# Patient Record
Sex: Female | Born: 1955 | Race: White | Hispanic: No | Marital: Single | Smoking: Never smoker
Health system: Southern US, Community
[De-identification: ages and names within clinical notes are randomized; demographics above are authoritative.]

## PROBLEM LIST (undated history)

## (undated) DIAGNOSIS — E119 Type 2 diabetes mellitus without complications: Secondary | ICD-10-CM

---
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
## 2000-02-01 ENCOUNTER — Other Ambulatory Visit: Admission: RE | Admit: 2000-02-01 | Discharge: 2000-02-01 | Payer: Self-pay | Admitting: Obstetrics & Gynecology

## 2000-06-27 ENCOUNTER — Ambulatory Visit (HOSPITAL_COMMUNITY): Admission: RE | Admit: 2000-06-27 | Discharge: 2000-06-27 | Payer: Self-pay | Admitting: Obstetrics & Gynecology

## 2002-04-13 ENCOUNTER — Other Ambulatory Visit: Admission: RE | Admit: 2002-04-13 | Discharge: 2002-04-13 | Payer: Self-pay | Admitting: Obstetrics and Gynecology

## 2003-04-29 ENCOUNTER — Other Ambulatory Visit: Admission: RE | Admit: 2003-04-29 | Discharge: 2003-04-29 | Payer: Self-pay | Admitting: Obstetrics and Gynecology

## 2004-05-19 ENCOUNTER — Other Ambulatory Visit: Admission: RE | Admit: 2004-05-19 | Discharge: 2004-05-19 | Payer: Self-pay | Admitting: Obstetrics and Gynecology

## 2009-02-26 ENCOUNTER — Encounter: Admission: RE | Admit: 2009-02-26 | Discharge: 2009-02-26 | Payer: Self-pay | Admitting: Obstetrics and Gynecology

## 2010-09-11 ENCOUNTER — Ambulatory Visit (HOSPITAL_COMMUNITY)
Admission: RE | Admit: 2010-09-11 | Discharge: 2010-09-11 | Payer: Self-pay | Source: Home / Self Care | Attending: Gastroenterology | Admitting: Gastroenterology

## 2016-05-27 ENCOUNTER — Other Ambulatory Visit: Payer: Self-pay | Admitting: Family Medicine

## 2016-05-27 DIAGNOSIS — Z1231 Encounter for screening mammogram for malignant neoplasm of breast: Secondary | ICD-10-CM

## 2016-06-09 ENCOUNTER — Ambulatory Visit: Payer: Self-pay

## 2016-06-29 ENCOUNTER — Ambulatory Visit
Admission: RE | Admit: 2016-06-29 | Discharge: 2016-06-29 | Disposition: A | Discharge status: Home / Self Care | Payer: BLUE CROSS/BLUE SHIELD | Source: Ambulatory Visit | Attending: Family Medicine | Admitting: Family Medicine

## 2016-06-29 DIAGNOSIS — Z1231 Encounter for screening mammogram for malignant neoplasm of breast: Secondary | ICD-10-CM

## 2017-09-12 ENCOUNTER — Other Ambulatory Visit: Payer: Self-pay | Admitting: Nurse Practitioner

## 2017-09-12 ENCOUNTER — Ambulatory Visit
Admission: RE | Admit: 2017-09-12 | Discharge: 2017-09-12 | Disposition: A | Discharge status: Home / Self Care | Payer: BLUE CROSS/BLUE SHIELD | Source: Ambulatory Visit | Attending: Nurse Practitioner | Admitting: Nurse Practitioner

## 2017-09-12 DIAGNOSIS — Z1231 Encounter for screening mammogram for malignant neoplasm of breast: Secondary | ICD-10-CM

## 2018-08-29 ENCOUNTER — Other Ambulatory Visit: Payer: Self-pay | Admitting: Nurse Practitioner

## 2018-08-29 DIAGNOSIS — Z1231 Encounter for screening mammogram for malignant neoplasm of breast: Secondary | ICD-10-CM

## 2018-09-27 ENCOUNTER — Ambulatory Visit
Admission: RE | Admit: 2018-09-27 | Discharge: 2018-09-27 | Disposition: A | Discharge status: Home / Self Care | Payer: BLUE CROSS/BLUE SHIELD | Source: Ambulatory Visit | Attending: Nurse Practitioner | Admitting: Nurse Practitioner

## 2018-09-27 DIAGNOSIS — Z1231 Encounter for screening mammogram for malignant neoplasm of breast: Secondary | ICD-10-CM

## 2019-02-10 ENCOUNTER — Inpatient Hospital Stay (HOSPITAL_COMMUNITY)
Admission: EM | Admit: 2019-02-10 | Discharge: 2019-02-13 | DRG: 638 | Disposition: A | Discharge status: Home / Self Care | Payer: BC Managed Care – PPO | Attending: Family Medicine | Admitting: Family Medicine

## 2019-02-10 ENCOUNTER — Encounter (HOSPITAL_COMMUNITY): Payer: Self-pay | Admitting: Emergency Medicine

## 2019-02-10 ENCOUNTER — Emergency Department (HOSPITAL_COMMUNITY): Payer: BC Managed Care – PPO

## 2019-02-10 DIAGNOSIS — Z1159 Encounter for screening for other viral diseases: Secondary | ICD-10-CM | POA: Diagnosis not present

## 2019-02-10 DIAGNOSIS — D649 Anemia, unspecified: Secondary | ICD-10-CM | POA: Diagnosis present

## 2019-02-10 DIAGNOSIS — E1310 Other specified diabetes mellitus with ketoacidosis without coma: Secondary | ICD-10-CM | POA: Diagnosis not present

## 2019-02-10 DIAGNOSIS — N179 Acute kidney failure, unspecified: Secondary | ICD-10-CM | POA: Diagnosis not present

## 2019-02-10 DIAGNOSIS — E1110 Type 2 diabetes mellitus with ketoacidosis without coma: Principal | ICD-10-CM

## 2019-02-10 DIAGNOSIS — D72829 Elevated white blood cell count, unspecified: Secondary | ICD-10-CM | POA: Diagnosis present

## 2019-02-10 DIAGNOSIS — Z794 Long term (current) use of insulin: Secondary | ICD-10-CM | POA: Diagnosis not present

## 2019-02-10 DIAGNOSIS — N390 Urinary tract infection, site not specified: Secondary | ICD-10-CM | POA: Diagnosis present

## 2019-02-10 DIAGNOSIS — E860 Dehydration: Secondary | ICD-10-CM | POA: Diagnosis present

## 2019-02-10 DIAGNOSIS — E11649 Type 2 diabetes mellitus with hypoglycemia without coma: Secondary | ICD-10-CM | POA: Diagnosis not present

## 2019-02-10 DIAGNOSIS — R9431 Abnormal electrocardiogram [ECG] [EKG]: Secondary | ICD-10-CM | POA: Diagnosis present

## 2019-02-10 DIAGNOSIS — Z20828 Contact with and (suspected) exposure to other viral communicable diseases: Secondary | ICD-10-CM | POA: Diagnosis present

## 2019-02-10 DIAGNOSIS — R1115 Cyclical vomiting syndrome unrelated to migraine: Secondary | ICD-10-CM | POA: Diagnosis present

## 2019-02-10 HISTORY — DX: Type 2 diabetes mellitus without complications: E11.9

## 2019-02-10 LAB — BLOOD GAS, ARTERIAL
Acid-base deficit: 28.2 mmol/L — ABNORMAL HIGH (ref 0.0–2.0)
Bicarbonate: 4.7 mmol/L — ABNORMAL LOW (ref 20.0–28.0)
FIO2: 21
O2 Saturation: 96 %
Patient temperature: 37
pCO2 arterial: 10 mmHg — CL (ref 32.0–48.0)
pH, Arterial: 6.931 — CL (ref 7.350–7.450)
pO2, Arterial: 144 mmHg — ABNORMAL HIGH (ref 83.0–108.0)

## 2019-02-10 LAB — CBG MONITORING, ED
Glucose-Capillary: >600 mg/dL (ref 70–99)
Glucose-Capillary: >600 mg/dL (ref 70–99)

## 2019-02-10 LAB — COMPREHENSIVE METABOLIC PANEL
ALT: 14 U/L (ref 0–44)
AST: 14 U/L — ABNORMAL LOW (ref 15–41)
Albumin: 4.4 g/dL (ref 3.5–5.0)
Alkaline Phosphatase: 144 U/L — ABNORMAL HIGH (ref 38–126)
Anion gap: 41 — ABNORMAL HIGH (ref 5–15)
BUN: 46 mg/dL — ABNORMAL HIGH (ref 8–23)
CO2: 5 mmol/L — ABNORMAL LOW (ref 22–32)
Calcium: 9 mg/dL (ref 8.9–10.3)
Chloride: 88 mmol/L — ABNORMAL LOW (ref 98–111)
Creatinine, Ser: 2.62 mg/dL — ABNORMAL HIGH (ref 0.44–1.00)
GFR calc Af Amer: 22 mL/min — ABNORMAL LOW (ref >60)
GFR calc non Af Amer: 19 mL/min — ABNORMAL LOW (ref >60)
Glucose, Bld: 1030 mg/dL (ref 70–99)
Potassium: 5.1 mmol/L (ref 3.5–5.1)
Sodium: 134 mmol/L — ABNORMAL LOW (ref 135–145)
Total Bilirubin: 2.4 mg/dL — ABNORMAL HIGH (ref 0.3–1.2)
Total Protein: 7.8 g/dL (ref 6.5–8.1)

## 2019-02-10 LAB — CBC WITH DIFFERENTIAL/PLATELET
Abs Immature Granulocytes: 2.11 10*3/uL — ABNORMAL HIGH (ref 0.00–0.07)
Abs Immature Granulocytes: 1.97 10*3/uL — ABNORMAL HIGH (ref 0.00–0.07)
Basophils Absolute: 0.4 10*3/uL — ABNORMAL HIGH (ref 0.0–0.1)
Basophils Absolute: 0.3 10*3/uL — ABNORMAL HIGH (ref 0.0–0.1)
Basophils Relative: 1 %
Basophils Relative: 1 %
Eosinophils Absolute: 0.1 10*3/uL (ref 0.0–0.5)
Eosinophils Absolute: 0.1 10*3/uL (ref 0.0–0.5)
Eosinophils Relative: 0 %
Eosinophils Relative: 0 %
HCT: 41.1 % (ref 36.0–46.0)
HCT: 36 % (ref 36.0–46.0)
Hemoglobin: 11.9 g/dL — ABNORMAL LOW (ref 12.0–15.0)
Hemoglobin: 10.5 g/dL — ABNORMAL LOW (ref 12.0–15.0)
Immature Granulocytes: 5 %
Immature Granulocytes: 5 %
Lymphocytes Relative: 5 %
Lymphocytes Relative: 6 %
Lymphs Abs: 2.1 10*3/uL (ref 0.7–4.0)
Lymphs Abs: 2.5 10*3/uL (ref 0.7–4.0)
MCH: 30.8 pg (ref 26.0–34.0)
MCH: 30.9 pg (ref 26.0–34.0)
MCHC: 29 g/dL — ABNORMAL LOW (ref 30.0–36.0)
MCHC: 29.2 g/dL — ABNORMAL LOW (ref 30.0–36.0)
MCV: 106.5 fL — ABNORMAL HIGH (ref 80.0–100.0)
MCV: 105.9 fL — ABNORMAL HIGH (ref 80.0–100.0)
Monocytes Absolute: 3.7 10*3/uL — ABNORMAL HIGH (ref 0.1–1.0)
Monocytes Absolute: 1.7 10*3/uL — ABNORMAL HIGH (ref 0.1–1.0)
Monocytes Relative: 9 %
Monocytes Relative: 4 %
Neutro Abs: 32.3 10*3/uL — ABNORMAL HIGH (ref 1.7–7.7)
Neutro Abs: 32.7 10*3/uL — ABNORMAL HIGH (ref 1.7–7.7)
Neutrophils Relative %: 80 %
Neutrophils Relative %: 84 %
Platelets: 580 10*3/uL — ABNORMAL HIGH (ref 150–400)
Platelets: 522 10*3/uL — ABNORMAL HIGH (ref 150–400)
RBC: 3.86 MIL/uL — ABNORMAL LOW (ref 3.87–5.11)
RBC: 3.4 MIL/uL — ABNORMAL LOW (ref 3.87–5.11)
RDW: 13.7 % (ref 11.5–15.5)
RDW: 13.6 % (ref 11.5–15.5)
WBC: 40.7 10*3/uL — ABNORMAL HIGH (ref 4.0–10.5)
WBC: 39.3 10*3/uL — ABNORMAL HIGH (ref 4.0–10.5)
nRBC: 0 % (ref 0.0–0.2)
nRBC: 0 % (ref 0.0–0.2)

## 2019-02-10 LAB — SARS CORONAVIRUS 2 BY RT PCR (HOSPITAL ORDER, PERFORMED IN ~~LOC~~ HOSPITAL LAB): SARS Coronavirus 2: NEGATIVE

## 2019-02-10 LAB — LACTIC ACID, PLASMA: Lactic Acid, Venous: 2.2 mmol/L (ref 0.5–1.9)

## 2019-02-10 MED ORDER — INSULIN REGULAR(HUMAN) IN NACL 100-0.9 UT/100ML-% IV SOLN
INTRAVENOUS | Status: DC
  Administered 2019-02-10: 22:00:00 5.4 [IU]/h via INTRAVENOUS
  Filled 2019-02-10: qty 100

## 2019-02-10 MED ORDER — DEXTROSE-NACL 5-0.45 % IV SOLN: INTRAVENOUS | Status: DC

## 2019-02-10 MED ORDER — SODIUM CHLORIDE 0.9 % IV SOLN
1.0000 g | Freq: Once | INTRAVENOUS | Status: AC
  Administered 2019-02-10: 1 g via INTRAVENOUS
  Filled 2019-02-10: qty 10

## 2019-02-10 MED ORDER — DEXTROSE 50 % IV SOLN: 25.0000 mL | INTRAVENOUS | Status: DC | PRN

## 2019-02-10 MED ORDER — ONDANSETRON HCL 4 MG/2ML IJ SOLN
4.0000 mg | Freq: Once | INTRAMUSCULAR | Status: AC
  Administered 2019-02-10: 4 mg via INTRAVENOUS
  Filled 2019-02-10: qty 2

## 2019-02-10 MED ORDER — SODIUM CHLORIDE 0.9 % IV BOLUS
2000.0000 mL | Freq: Once | INTRAVENOUS | Status: AC
  Administered 2019-02-10: 2000 mL via INTRAVENOUS

## 2019-02-10 MED ORDER — SODIUM CHLORIDE 0.9 % IV SOLN
INTRAVENOUS | Status: DC
  Administered 2019-02-10: via INTRAVENOUS

## 2019-02-10 MED ORDER — INSULIN REGULAR BOLUS VIA INFUSION
0.0000 [IU] | Freq: Three times a day (TID) | INTRAVENOUS | Status: DC
  Filled 2019-02-10: qty 10

## 2019-02-10 MED ORDER — SODIUM CHLORIDE 0.9 % IV BOLUS
1000.0000 mL | Freq: Once | INTRAVENOUS | Status: AC
  Administered 2019-02-10: 22:00:00 1000 mL via INTRAVENOUS

## 2019-02-10 NOTE — ED Provider Notes (Signed)
[facility] EMERGENCY DEPARTMENT Provider Note   CSN: [id number] Arrival date & time: 02/10/19  2016     History   Chief Complaint Chief Complaint  Patient presents with  . Emesis  . Hyperglycemia    HPI Alicia KinsmanRebecca L Andrews is a 63 y.o. female.      Emesis Associated symptoms: diarrhea   Hyperglycemia Associated symptoms: vomiting    LEVEL 5 Caveat Level 5 caveat due to condition of the patient Alicia Andrews is a 63 y.o. female with hx of DM, who presents to the Emergency Department complaining of nausea, vomiting, and diarrhea.  Symptoms began 1 day prior to arrival.  Per EMS patient was slow to respond to questioning at home and blood sugar was 530.  Patient admits that she has not been checking her blood sugars at home and she is unclear if she has been taking her insulin or metformin.  She also reports having some burning with urination for few days.  She denies pain, fever, chills, shortness of breath.      Past Medical History:  Diagnosis Date  . Diabetes mellitus without complication (HCC)    Type 2     There are no active problems to display for this patient.   History reviewed. No pertinent surgical history.   OB History   No obstetric history on file.      Home Medications    Prior to Admission medications   Not on File    Family History History reviewed. No pertinent family history.  Social History Social History   Tobacco Use  . Smoking status: Never Smoker  . Smokeless tobacco: Never Used  Substance Use Topics  . Alcohol use: Not on file  . Drug use: Not on file     Allergies   Patient has no allergy information on record.   Review of Systems Review of Systems  Unable to perform ROS: Acuity of condition  Gastrointestinal: Positive for diarrhea and vomiting.     Physical Exam Updated Vital Signs BP (!) 98/56   Pulse (!) 115   Temp 97.8 F (36.6 C) (Oral)   Resp 18   Ht 5' (1.524 m)   Wt 57.6 kg   SpO2 100%    BMI 24.80 kg/m   Physical Exam Vitals signs and nursing note reviewed.  Constitutional:      Appearance: She is ill-appearing.  HENT:     Head: Atraumatic.     Mouth/Throat:     Mouth: Mucous membranes are dry.  Eyes:     Pupils: Pupils are equal, round, and reactive to light.  Cardiovascular:     Rate and Rhythm: Tachycardia present.     Pulses: Normal pulses.  Pulmonary:     Effort: Pulmonary effort is normal. Tachypnea present.     Breath sounds: No wheezing or rales.  Abdominal:     General: There is no distension.     Palpations: Abdomen is soft. There is no mass.     Tenderness: There is no abdominal tenderness. There is no guarding.  Musculoskeletal: Normal range of motion.  Skin:    General: Skin is warm.     Capillary Refill: Capillary refill takes less than 2 seconds.     Findings: No rash.  Neurological:     Mental Status: She is lethargic.     GCS: GCS eye subscore is 3. GCS verbal subscore is 4. GCS motor subscore is 6.     Sensory: Sensation is intact.  Motor: Motor function is intact.      ED Treatments / Results  Labs (all labs ordered are listed, but only abnormal results are displayed) Labs Reviewed  CBC WITH DIFFERENTIAL/PLATELET - Abnormal; Notable for the following components:      Result Value   WBC 40.7 (*)    RBC 3.86 (*)    Hemoglobin 11.9 (*)    MCV 106.5 (*)    MCHC 29.0 (*)    Platelets 580 (*)    Neutro Abs 32.3 (*)    Monocytes Absolute 3.7 (*)    Basophils Absolute 0.4 (*)    Abs Immature Granulocytes 2.11 (*)    All other components within normal limits  COMPREHENSIVE METABOLIC PANEL - Abnormal; Notable for the following components:   Sodium 134 (*)    Chloride 88 (*)    CO2 5 (*)    Glucose, Bld 1,030 (*)    BUN 46 (*)    Creatinine, Ser 2.62 (*)    AST 14 (*)    Alkaline Phosphatase 144 (*)    Total Bilirubin 2.4 (*)    GFR calc non Af Amer 19 (*)    GFR calc Af Amer 22 (*)    Anion gap 41 (*)    All other  components within normal limits  BLOOD GAS, ARTERIAL - Abnormal; Notable for the following components:   pH, Arterial 6.931 (*)    pCO2 arterial 10.0 (*)    pO2, Arterial 144 (*)    Bicarbonate 4.7 (*)    Acid-base deficit 28.2 (*)    Allens test (pass/fail) NOT INDICATED (*)    All other components within normal limits  CBG MONITORING, ED - Abnormal; Notable for the following components:   Glucose-Capillary >600 (*)    All other components within normal limits  SARS CORONAVIRUS 2 (HOSPITAL ORDER, PERFORMED IN [facility])  CULTURE, BLOOD (ROUTINE X 2)  CULTURE, BLOOD (ROUTINE X 2)  LACTIC ACID, PLASMA  LACTIC ACID, PLASMA  CBC WITH DIFFERENTIAL/PLATELET  URINALYSIS, ROUTINE W REFLEX MICROSCOPIC  I-STAT CHEM 8, ED    EKG   EKG reviewed by Dr. Estell HarpinZammit   Radiology Dg Chest Portable 1 View  Result Date: 02/10/2019 CLINICAL DATA:  Nausea, vomiting, diarrhea beginning yesterday at 1700 hours, hyperglycemia, semi responsive, pain, history type II diabetes mellitus EXAM: PORTABLE CHEST 1 VIEW COMPARISON:  Portable exam 2111 hours without priors for comparison FINDINGS: Normal heart size, mediastinal contours, and pulmonary vascularity. Lungs clear. No pleural effusion or pneumothorax. Bones unremarkable. IMPRESSION: No acute abnormalities. Electronically Signed   By: Ulyses SouthwardMark  Boles M.D.   On: 02/10/2019 21:40    Procedures Procedures (including critical care time)  Medications Ordered in ED Medications  dextrose 5 %-0.45 % sodium chloride infusion (has no administration in time range)  insulin regular bolus via infusion 0-10 Units (has no administration in time range)  insulin regular, human (MYXREDLIN) 100 units/ 100 mL infusion (5.4 Units/hr Intravenous New Bag/Given 02/10/19 2135)  dextrose 50 % solution 25 mL (has no administration in time range)  0.9 %  sodium chloride infusion (has no administration in time range)  cefTRIAXone (ROCEPHIN) 1 g in sodium chloride 0.9 %  100 mL IVPB (has no administration in time range)  sodium chloride 0.9 % bolus 2,000 mL (0 mLs Intravenous Stopped 02/10/19 2208)  ondansetron (ZOFRAN) injection 4 mg (4 mg Intravenous Given 02/10/19 2136)  sodium chloride 0.9 % bolus 1,000 mL (1,000 mLs Intravenous New Bag/Given 02/10/19 2208)  Initial Impression / Assessment and Plan / ED Course  I have reviewed the triage vital signs and the nursing notes.  Pertinent labs & imaging results that were available during my care of the patient were reviewed by me and considered in my medical decision making (see chart for details).        Patient tachycardic, tachypneic with nausea vomiting diarrhea reported for 24 hours.  CBG here greater than 600.  Symptoms likely DKA.  Code sepsis initiated.  Patient also seen by Dr. Roderic Palau and care plan discussed.  Will initiate antibiotics, IV fluids, and glucose stabilizer.  CRITICAL CARE Performed by: Antoneo Ghrist Total critical care time: 30 minutes Critical care time was exclusive of separately billable procedures and treating other patients. Critical care was necessary to treat or prevent imminent or life-threatening deterioration. Critical care was time spent personally by me on the following activities: development of treatment plan with patient and/or surrogate as well as nursing, discussions with consultants, evaluation of patient's response to treatment, examination of patient, obtaining history from patient or surrogate, ordering and performing treatments and interventions, ordering and review of laboratory studies, ordering and review of radiographic studies, pulse oximetry and re-evaluation of patient's condition.   On recheck, patient appears to be improving after IV fluids and insulin.  Mentation improving and she appears more alert.    [place] Dr. Darrick Meigs and discussed findings, he agrees to admit.    Final Clinical Impressions(s) / ED Diagnoses   Final diagnoses:  Diabetic  ketoacidosis without coma associated with other specified diabetes mellitus [facility])    ED Discharge Orders    None       Kem Parkinson, PA-C 02/10/19 2241    Milton Ferguson, MD 02/13/19 1308

## 2019-02-10 NOTE — ED Notes (Signed)
Critical Blood gas Result given To Dr Roderic Palau by RT. Nurse notified. Results in computer.

## 2019-02-10 NOTE — H&P (Addendum)
TRH H&P    Patient Demographics:    Alicia ChafeRebecca Kovacich, is a 63 y.o. female  MRN: [id number]  DOB - May 15, 1956  Admit Date - 02/10/2019  Referring MD/NP/PA: Dr. Estell HarpinZammit  Outpatient Primary MD for the patient is [name], Teresa, FNP  Patient coming from: Home  Chief complaint-vomiting   HPI:    Alicia Andrews  is a 63 y.o. female, with history of diabetes mellitus type 2, came to hospital with complaints of nausea vomiting and diarrhea which began on Wednesday evening.  Patient says that she usually takes insulin 70/30 twice a day, her last dose was on Wednesday.  Patient says that she was vomiting, and also developed diarrhea.  She complains of dysuria.  Denies abdominal pain. Denies chest pain, shortness of breath, fever or chills. SARS-CoV-2 is negative in the ED. Lab work showed blood glucose of 1031.  Patient was found to be in DKA with anion gap of 41. Creatinine 2.62.  Unknown baseline. ABG showed pH 6.931, PCO2 10.0. Patient started on DKA protocol. Also started on ceftriaxone for possible UTI    Review of systems:    In addition to the HPI above,   All other systems reviewed and are negative.    Past History of the following :    Past Medical History:  Diagnosis Date  . Diabetes mellitus without complication (HCC)    Type 2       History reviewed. No pertinent surgical history.    Social History:      Social History   Tobacco Use  . Smoking status: Never Smoker  . Smokeless tobacco: Never Used  Substance Use Topics  . Alcohol use: Not on file       Family History :   Unremarkable   Home Medications:   Prior to Admission medications   Medication Sig Start Date End Date Taking? Authorizing Provider  atorvastatin (LIPITOR) 10 MG tablet Take 10 mg by mouth daily. 11/05/18  Yes [provider]  insulin NPH-regular Human (NOVOLIN 70/30 RELION) (70-30) 100  UNIT/ML injection Inject 25-50 Units as directed See admin instructions. INJECT 30 MINUTES BEFORE BREAKFAST AND SUPPER BASED ON TITRATION GUIDELINE, 25 TO 50 UNITS EACH MEAL 09/20/18  Yes [provider]  metFORMIN (GLUCOPHAGE-XR) 500 MG 24 hr tablet Take 2 tablets by mouth 2 (two) times a day. 09/20/18  Yes [provider]     Allergies:    Not on File   Physical Exam:   Vitals  Blood pressure (!) 92/57, pulse (!) 109, temperature 97.8 F (36.6 C), temperature source Oral, resp. rate (!) 23, height 5' (1.524 m), weight 57.6 kg, SpO2 100 %.  1.  General: Appears lethargic  2. Psychiatric: Lethargic but arousable, answering all questions appropriately.  Intact insight and judgment  3. Neurologic: Cranial nerves II through grossly intact, moving all extremities, no focal neurological deficit.  4. HEENMT:  Atraumatic normocephalic, extraocular muscles are intact  5. Respiratory : Clear to auscultation bilaterally  6. Cardiovascular : S1-S2, regular, no murmur auscultated  7. Gastrointestinal:  Abdomen is soft, nontender, no organomegaly      Data Review:    CBC Recent Labs  Lab 02/10/19 2107 02/10/19 2209  WBC 40.7* 39.3*  HGB 11.9* 10.5*  HCT 41.1 36.0  PLT 580* 522*  MCV 106.5* 105.9*  MCH 30.8 30.9  MCHC 29.0* 29.2*  RDW 13.7 13.6  LYMPHSABS 2.1 2.5  MONOABS 3.7* 1.7*  EOSABS 0.1 0.1  BASOSABS 0.4* 0.3*   ------------------------------------------------------------------------------------------------------------------  Results for orders placed or performed during the hospital encounter of 02/10/19 (from the past 48 hour(s))  CBG monitoring, ED     Status: Abnormal   Collection Time: 02/10/19  8:37 PM  Result Value Ref Range   Glucose-Capillary >600 (HH) 70 - 99 mg/dL  CBC with Differential/Platelet     Status: Abnormal   Collection Time: 02/10/19  9:07 PM  Result Value Ref Range   WBC 40.7 (H) 4.0 - 10.5 K/uL   RBC 3.86 (L) 3.87 -  5.11 MIL/uL   Hemoglobin 11.9 (L) 12.0 - 15.0 g/dL   HCT 16.141.1 09.636.0 - 04.546.0 %   MCV 106.5 (H) 80.0 - 100.0 fL   MCH 30.8 26.0 - 34.0 pg   MCHC 29.0 (L) 30.0 - 36.0 g/dL   RDW 40.913.7 81.111.5 - 91.415.5 %   Platelets 580 (H) 150 - 400 K/uL   nRBC 0.0 0.0 - 0.2 %   Neutrophils Relative % 80 %   Neutro Abs 32.3 (H) 1.7 - 7.7 K/uL   Lymphocytes Relative 5 %   Lymphs Abs 2.1 0.7 - 4.0 K/uL   Monocytes Relative 9 %   Monocytes Absolute 3.7 (H) 0.1 - 1.0 K/uL   Eosinophils Relative 0 %   Eosinophils Absolute 0.1 0.0 - 0.5 K/uL   Basophils Relative 1 %   Basophils Absolute 0.4 (H) 0.0 - 0.1 K/uL   Immature Granulocytes 5 %   Abs Immature Granulocytes 2.11 (H) 0.00 - 0.07 K/uL    Comment: Performed at [facility], [address] [place], [place] [address]  Comprehensive metabolic panel     Status: Abnormal   Collection Time: 02/10/19  9:07 PM  Result Value Ref Range   Sodium 134 (L) 135 - 145 mmol/L   Potassium 5.1 3.5 - 5.1 mmol/L   Chloride 88 (L) 98 - 111 mmol/L   CO2 5 (L) 22 - 32 mmol/L   Glucose, Bld 1,030 (HH) 70 - 99 mg/dL    Comment: CRITICAL RESULT CALLED TO, READ BACK BY AND VERIFIED WITH: WALKER,T @ 2158 ON 02/10/19 BY JUW    BUN 46 (H) 8 - 23 mg/dL   Creatinine, Ser 6.212.62 (H) 0.44 - 1.00 mg/dL   Calcium 9.0 8.9 - 30.810.3 mg/dL   Total Protein 7.8 6.5 - 8.1 g/dL   Albumin 4.4 3.5 - 5.0 g/dL   AST 14 (L) 15 - 41 U/L   ALT 14 0 - 44 U/L   Alkaline Phosphatase 144 (H) 38 - 126 U/L   Total Bilirubin 2.4 (H) 0.3 - 1.2 mg/dL   GFR calc non Af Amer 19 (L) >60 mL/min   GFR calc Af Amer 22 (L) >60 mL/min   Anion gap 41 (H) 5 - 15    Comment: Performed at [facility], [address] [place], [place] [address]  SARS Coronavirus 2 (CEPHEID- Performed in [facility] hospital lab), Hosp Order     Status: None   Collection Time: 02/10/19  9:10 PM   Specimen: Nasopharyngeal Swab  Result Value Ref Range  SARS Coronavirus 2 NEGATIVE NEGATIVE    Comment: (NOTE) If result is NEGATIVE SARS-CoV-2  target nucleic acids are NOT DETECTED. The SARS-CoV-2 RNA is generally detectable in upper and lower  respiratory specimens during the acute phase of infection. The lowest  concentration of SARS-CoV-2 viral copies this assay can detect is 250  copies / mL. A negative result does not preclude SARS-CoV-2 infection  and should not be used as the sole basis for treatment or other  patient management decisions.  A negative result may occur with  improper specimen collection / handling, submission of specimen other  than nasopharyngeal swab, presence of viral mutation(s) within the  areas targeted by this assay, and inadequate number of viral copies  (<250 copies / mL). A negative result must be combined with clinical  observations, patient history, and epidemiological information. If result is POSITIVE SARS-CoV-2 target nucleic acids are DETECTED. The SARS-CoV-2 RNA is generally detectable in upper and lower  respiratory specimens dur ing the acute phase of infection.  Positive  results are indicative of active infection with SARS-CoV-2.  Clinical  correlation with patient history and other diagnostic information is  necessary to determine patient infection status.  Positive results do  not rule out bacterial infection or co-infection with other viruses. If result is PRESUMPTIVE POSTIVE SARS-CoV-2 nucleic acids MAY BE PRESENT.   A presumptive positive result was obtained on the submitted specimen  and confirmed on repeat testing.  While 2019 novel coronavirus  (SARS-CoV-2) nucleic acids may be present in the submitted sample  additional confirmatory testing may be necessary for epidemiological  and / or clinical management purposes  to differentiate between  SARS-CoV-2 and other Sarbecovirus currently known to infect humans.  If clinically indicated additional testing with an alternate test  methodology [phone]) is advised. The SARS-CoV-2 RNA is generally  detectable in upper and lower  respiratory sp ecimens during the acute  phase of infection. The expected result is Negative. Fact Sheet for Patients:  StrictlyIdeas.no Fact Sheet for Healthcare Providers: BankingDealers.co.za This test is not yet approved or cleared by the [place] FDA and has been authorized for detection and/or diagnosis of SARS-CoV-2 by FDA under an Emergency Use Authorization (EUA).  This EUA will remain in effect (meaning this test can be used) for the duration of the COVID-19 declaration under Section 564(b)(1) of the Act, 21 U.S.C. section 360bbb-3(b)(1), unless the authorization is terminated or revoked sooner. Performed at [facility], [address], [place], [place] [address]   Blood gas, arterial     Status: Abnormal   Collection Time: 02/10/19 10:04 PM  Result Value Ref Range   FIO2 21.00    pH, Arterial 6.931 (LL) 7.350 - 7.450    Comment: CRITICAL RESULT CALLED TO, READ BACK BY AND VERIFIED WITH: DR.ZAMMIT @ 2213 ON 02/10/19 BY JUW    pCO2 arterial 10.0 (LL) 32.0 - 48.0 mmHg    Comment: CRITICAL RESULT CALLED TO, READ BACK BY AND VERIFIED WITH: DR.ZAMMIT @ 2213 ON 02/10/19 BY JUW    pO2, Arterial 144 (H) 83.0 - 108.0 mmHg   Bicarbonate 4.7 (L) 20.0 - 28.0 mmol/L   Acid-base deficit 28.2 (H) 0.0 - 2.0 mmol/L   O2 Saturation 96.0 %   Patient temperature 37.0    Allens test (pass/fail) NOT INDICATED (A) PASS    Comment: Performed at [facility], [address] [place], [place] [address]  Lactic acid, plasma     Status: Abnormal   Collection Time: 02/10/19 10:09  PM  Result Value Ref Range   Lactic Acid, Venous 2.2 (HH) 0.5 - 1.9 mmol/L    Comment: CRITICAL RESULT CALLED TO, READ BACK BY AND VERIFIED WITH: POINDEXTER,M @ 2317 ON 02/10/19 BY JUW Performed at [facility], [address], [place], [place] [address]   CBC WITH DIFFERENTIAL     Status: Abnormal   Collection Time: 02/10/19 10:09 PM  Result Value Ref Range   WBC 39.3  (H) 4.0 - 10.5 K/uL   RBC 3.40 (L) 3.87 - 5.11 MIL/uL   Hemoglobin 10.5 (L) 12.0 - 15.0 g/dL   HCT 13.0 86.5 - 78.4 %   MCV 105.9 (H) 80.0 - 100.0 fL   MCH 30.9 26.0 - 34.0 pg   MCHC 29.2 (L) 30.0 - 36.0 g/dL   RDW 69.6 29.5 - 28.4 %   Platelets 522 (H) 150 - 400 K/uL   nRBC 0.0 0.0 - 0.2 %   Neutrophils Relative % 84 %   Neutro Abs 32.7 (H) 1.7 - 7.7 K/uL   Lymphocytes Relative 6 %   Lymphs Abs 2.5 0.7 - 4.0 K/uL   Monocytes Relative 4 %   Monocytes Absolute 1.7 (H) 0.1 - 1.0 K/uL   Eosinophils Relative 0 %   Eosinophils Absolute 0.1 0.0 - 0.5 K/uL   Basophils Relative 1 %   Basophils Absolute 0.3 (H) 0.0 - 0.1 K/uL   Immature Granulocytes 5 %   Abs Immature Granulocytes 1.97 (H) 0.00 - 0.07 K/uL    Comment: Performed at [facility], [address] [place], [place] [address]  Blood Culture (routine x 2)     Status: None (Preliminary result)   Collection Time: 02/10/19 10:10 PM   Specimen: BLOOD LEFT FOREARM  Result Value Ref Range   Specimen Description BLOOD LEFT FOREARM    Special Requests      BOTTLES DRAWN AEROBIC AND ANAEROBIC Blood Culture adequate volume Performed at [facility], [address], [place], [place] [address]    Culture PENDING    Report Status PENDING   Blood Culture (routine x 2)     Status: None (Preliminary result)   Collection Time: 02/10/19 10:20 PM   Specimen: Left Antecubital; Blood  Result Value Ref Range   Specimen Description LEFT ANTECUBITAL    Special Requests      BOTTLES DRAWN AEROBIC AND ANAEROBIC Blood Culture adequate volume Performed at [facility], [address] [place], [place] [address]    Culture PENDING    Report Status PENDING   CBG monitoring, ED     Status: Abnormal   Collection Time: 02/10/19 10:45 PM  Result Value Ref Range   Glucose-Capillary >600 (HH) 70 - 99 mg/dL    Chemistries  Recent Labs  Lab 02/10/19 2107  NA 134*  K 5.1  CL 88*  CO2 5*  GLUCOSE 1,030*  BUN 46*  CREATININE 2.62*  CALCIUM 9.0   AST 14*  ALT 14  ALKPHOS 144*  BILITOT 2.4*   ------------------------------------------------------------------------------------------------------------------  ------------------------------------------------------------------------------------------------------------------ GFR: Estimated Creatinine Clearance: 17.9 mL/min (A) (by C-G formula based on SCr of 2.62 mg/dL (H)). Liver Function Tests: Recent Labs  Lab 02/10/19 2107  AST 14*  ALT 14  ALKPHOS 144*  BILITOT 2.4*  PROT 7.8  ALBUMIN 4.4   CBG: Recent Labs  Lab 02/10/19 2037 02/10/19 2245  GLUCAP >600* >600*      Imaging Results:    Dg Chest Portable 1 View  Result Date: 02/10/2019 CLINICAL DATA:  Nausea, vomiting, diarrhea beginning  yesterday at 1700 hours, hyperglycemia, semi responsive, pain, history type II diabetes mellitus EXAM: PORTABLE CHEST 1 VIEW COMPARISON:  Portable exam 2111 hours without priors for comparison FINDINGS: Normal heart size, mediastinal contours, and pulmonary vascularity. Lungs clear. No pleural effusion or pneumothorax. Bones unremarkable. IMPRESSION: No acute abnormalities. Electronically Signed   By: Ulyses SouthwardMark  Boles M.D.   On: 02/10/2019 21:40    My personal review of EKG: Rhythm NSR, QTC prolonged at 502   Assessment & Plan:    Active Problems:   DKA (diabetic ketoacidoses) (HCC)   1. Diabetic ketoacidosis-patient has severe DKA, anion gap of 41.  Will admit patient to stepdown unit.  Started on DKA protocol, ABG showed pH of 6.931.  Will not give bicarb at this time, as usually indicated for pH less than 6.9.  I am hoping that patient's pH is going to improve with aggressive IV fluid hydration as well as IV insulin.  Will repeat ABG.  Would consider bicarb if pH persist less than 7.0.  Check BMP every 4 hours.  2. QTC prolongation-QTC is 502, will check serum magnesium.  3. UTI-patient complained of dysuria.  Started on ceftriaxone.Urine is clear.  Follow urine culture    DVT  Prophylaxis-   Lovenox   AM Labs Ordered, also please review Full Orders  Family Communication: Admission, patients condition and plan of care including tests being ordered have been discussed with the patient  who indicate understanding and agree with the plan and Code Status.  Code Status: Full code  Admission status: Inpatient: Based on patients clinical presentation and evaluation of above clinical data, I have made determination that patient meets Inpatient criteria at this time.  Time spent in minutes : 60 minutes   Meredeth IdeGagan S Tyniah Kastens M.D on 02/10/2019 at 11:45 PM

## 2019-02-10 NOTE — ED Notes (Addendum)
CRITICAL VALUE ALERT  Critical Value:  Blood Glucose 1,030  Date & Time Notied:  02/10/19 2200  Provider Notified: EDP Zammit   Orders Received/Actions taken: No orders at this time

## 2019-02-10 NOTE — ED Triage Notes (Signed)
Pt came via EMS for nausea, vomiting, and diarrhea that started yesterday afternoon at 5pm. Pt blood sugar 530 at EMS arrival.

## 2019-02-10 NOTE — ED Notes (Signed)
Date and time results received: 02/10/19 23:20 (use smartphrase ".now" to insert current time)  Test: lactic acid  Critical Value: 2.2  Name of Provider Notified: Tammy PA  Orders Received? Or Actions Taken?: none

## 2019-02-11 ENCOUNTER — Other Ambulatory Visit: Payer: Self-pay

## 2019-02-11 ENCOUNTER — Encounter (HOSPITAL_COMMUNITY): Payer: Self-pay

## 2019-02-11 DIAGNOSIS — E1110 Type 2 diabetes mellitus with ketoacidosis without coma: Principal | ICD-10-CM

## 2019-02-11 LAB — BASIC METABOLIC PANEL
Anion gap: 33 — ABNORMAL HIGH (ref 5–15)
Anion gap: 25 — ABNORMAL HIGH (ref 5–15)
Anion gap: 20 — ABNORMAL HIGH (ref 5–15)
Anion gap: 12 (ref 5–15)
Anion gap: 10 (ref 5–15)
Anion gap: 8 (ref 5–15)
BUN: 40 mg/dL — ABNORMAL HIGH (ref 8–23)
BUN: 38 mg/dL — ABNORMAL HIGH (ref 8–23)
BUN: 39 mg/dL — ABNORMAL HIGH (ref 8–23)
BUN: 35 mg/dL — ABNORMAL HIGH (ref 8–23)
BUN: 31 mg/dL — ABNORMAL HIGH (ref 8–23)
BUN: 25 mg/dL — ABNORMAL HIGH (ref 8–23)
CO2: 4 mmol/L — ABNORMAL LOW (ref 22–32)
CO2: 9 mmol/L — ABNORMAL LOW (ref 22–32)
CO2: 15 mmol/L — ABNORMAL LOW (ref 22–32)
CO2: 20 mmol/L — ABNORMAL LOW (ref 22–32)
CO2: 21 mmol/L — ABNORMAL LOW (ref 22–32)
CO2: 19 mmol/L — ABNORMAL LOW (ref 22–32)
Calcium: 7.6 mg/dL — ABNORMAL LOW (ref 8.9–10.3)
Calcium: 7.7 mg/dL — ABNORMAL LOW (ref 8.9–10.3)
Calcium: 7.9 mg/dL — ABNORMAL LOW (ref 8.9–10.3)
Calcium: 7.6 mg/dL — ABNORMAL LOW (ref 8.9–10.3)
Calcium: 7.3 mg/dL — ABNORMAL LOW (ref 8.9–10.3)
Calcium: 7.2 mg/dL — ABNORMAL LOW (ref 8.9–10.3)
Chloride: 102 mmol/L (ref 98–111)
Chloride: 109 mmol/L (ref 98–111)
Chloride: 108 mmol/L (ref 98–111)
Chloride: 110 mmol/L (ref 98–111)
Chloride: 109 mmol/L (ref 98–111)
Chloride: 112 mmol/L — ABNORMAL HIGH (ref 98–111)
Creatinine, Ser: 2.1 mg/dL — ABNORMAL HIGH (ref 0.44–1.00)
Creatinine, Ser: 1.91 mg/dL — ABNORMAL HIGH (ref 0.44–1.00)
Creatinine, Ser: 1.64 mg/dL — ABNORMAL HIGH (ref 0.44–1.00)
Creatinine, Ser: 1.29 mg/dL — ABNORMAL HIGH (ref 0.44–1.00)
Creatinine, Ser: 1.11 mg/dL — ABNORMAL HIGH (ref 0.44–1.00)
Creatinine, Ser: 0.81 mg/dL (ref 0.44–1.00)
GFR calc Af Amer: 29 mL/min — ABNORMAL LOW (ref >60)
GFR calc Af Amer: 32 mL/min — ABNORMAL LOW (ref >60)
GFR calc Af Amer: 39 mL/min — ABNORMAL LOW (ref >60)
GFR calc Af Amer: 52 mL/min — ABNORMAL LOW (ref >60)
GFR calc Af Amer: >60 mL/min (ref >60)
GFR calc Af Amer: >60 mL/min (ref >60)
GFR calc non Af Amer: 25 mL/min — ABNORMAL LOW (ref >60)
GFR calc non Af Amer: 28 mL/min — ABNORMAL LOW (ref >60)
GFR calc non Af Amer: 33 mL/min — ABNORMAL LOW (ref >60)
GFR calc non Af Amer: 45 mL/min — ABNORMAL LOW (ref >60)
GFR calc non Af Amer: 54 mL/min — ABNORMAL LOW (ref >60)
GFR calc non Af Amer: >60 mL/min (ref >60)
Glucose, Bld: 712 mg/dL (ref 70–99)
Glucose, Bld: 454 mg/dL — ABNORMAL HIGH (ref 70–99)
Glucose, Bld: 201 mg/dL — ABNORMAL HIGH (ref 70–99)
Glucose, Bld: 141 mg/dL — ABNORMAL HIGH (ref 70–99)
Glucose, Bld: 246 mg/dL — ABNORMAL HIGH (ref 70–99)
Glucose, Bld: 96 mg/dL (ref 70–99)
Potassium: 3.9 mmol/L (ref 3.5–5.1)
Potassium: 3.6 mmol/L (ref 3.5–5.1)
Potassium: 3.4 mmol/L — ABNORMAL LOW (ref 3.5–5.1)
Potassium: 3.3 mmol/L — ABNORMAL LOW (ref 3.5–5.1)
Potassium: 3.3 mmol/L — ABNORMAL LOW (ref 3.5–5.1)
Potassium: 3 mmol/L — ABNORMAL LOW (ref 3.5–5.1)
Sodium: 139 mmol/L (ref 135–145)
Sodium: 143 mmol/L (ref 135–145)
Sodium: 143 mmol/L (ref 135–145)
Sodium: 142 mmol/L (ref 135–145)
Sodium: 140 mmol/L (ref 135–145)
Sodium: 139 mmol/L (ref 135–145)

## 2019-02-11 LAB — GLUCOSE, CAPILLARY
Glucose-Capillary: 119 mg/dL — ABNORMAL HIGH (ref 70–99)
Glucose-Capillary: 120 mg/dL — ABNORMAL HIGH (ref 70–99)
Glucose-Capillary: 124 mg/dL — ABNORMAL HIGH (ref 70–99)
Glucose-Capillary: 130 mg/dL — ABNORMAL HIGH (ref 70–99)
Glucose-Capillary: 141 mg/dL — ABNORMAL HIGH (ref 70–99)
Glucose-Capillary: 167 mg/dL — ABNORMAL HIGH (ref 70–99)
Glucose-Capillary: 180 mg/dL — ABNORMAL HIGH (ref 70–99)
Glucose-Capillary: 185 mg/dL — ABNORMAL HIGH (ref 70–99)
Glucose-Capillary: 193 mg/dL — ABNORMAL HIGH (ref 70–99)
Glucose-Capillary: 199 mg/dL — ABNORMAL HIGH (ref 70–99)
Glucose-Capillary: 82 mg/dL (ref 70–99)
Glucose-Capillary: 86 mg/dL (ref 70–99)
Glucose-Capillary: 93 mg/dL (ref 70–99)

## 2019-02-11 LAB — URINALYSIS, ROUTINE W REFLEX MICROSCOPIC
Bilirubin Urine: NEGATIVE
Glucose, UA: >=500 mg/dL — AB
Ketones, ur: 80 mg/dL — AB
Leukocytes,Ua: NEGATIVE
Nitrite: NEGATIVE
Protein, ur: NEGATIVE mg/dL
Specific Gravity, Urine: 1.015 (ref 1.005–1.030)
pH: 5 (ref 5.0–8.0)

## 2019-02-11 LAB — CBG MONITORING, ED
Glucose-Capillary: >600 mg/dL (ref 70–99)
Glucose-Capillary: 599 mg/dL (ref 70–99)
Glucose-Capillary: 520 mg/dL (ref 70–99)
Glucose-Capillary: 443 mg/dL — ABNORMAL HIGH (ref 70–99)
Glucose-Capillary: 334 mg/dL — ABNORMAL HIGH (ref 70–99)
Glucose-Capillary: 291 mg/dL — ABNORMAL HIGH (ref 70–99)
Glucose-Capillary: 169 mg/dL — ABNORMAL HIGH (ref 70–99)
Glucose-Capillary: 135 mg/dL — ABNORMAL HIGH (ref 70–99)

## 2019-02-11 LAB — CBC
HCT: 33 % — ABNORMAL LOW (ref 36.0–46.0)
Hemoglobin: 10.5 g/dL — ABNORMAL LOW (ref 12.0–15.0)
MCH: 31.2 pg (ref 26.0–34.0)
MCHC: 31.8 g/dL (ref 30.0–36.0)
MCV: 97.9 fL (ref 80.0–100.0)
Platelets: 366 10*3/uL (ref 150–400)
RBC: 3.37 MIL/uL — ABNORMAL LOW (ref 3.87–5.11)
RDW: 13.3 % (ref 11.5–15.5)
WBC: 34.8 10*3/uL — ABNORMAL HIGH (ref 4.0–10.5)
nRBC: 0 % (ref 0.0–0.2)

## 2019-02-11 LAB — MRSA PCR SCREENING: MRSA by PCR: NEGATIVE

## 2019-02-11 LAB — MAGNESIUM: Magnesium: 1.8 mg/dL (ref 1.7–2.4)

## 2019-02-11 LAB — BLOOD GAS, ARTERIAL
Acid-base deficit: 25.5 mmol/L — ABNORMAL HIGH (ref 0.0–2.0)
Bicarbonate: 6.5 mmol/L — ABNORMAL LOW (ref 20.0–28.0)
FIO2: 21
O2 Saturation: 97.2 %
Patient temperature: 37
pCO2 arterial: 11.6 mmHg — CL (ref 32.0–48.0)
pH, Arterial: 7.071 — CL (ref 7.350–7.450)
pO2, Arterial: 132 mmHg — ABNORMAL HIGH (ref 83.0–108.0)

## 2019-02-11 LAB — LACTIC ACID, PLASMA: Lactic Acid, Venous: 2.1 mmol/L (ref 0.5–1.9)

## 2019-02-11 MED ORDER — PNEUMOCOCCAL VAC POLYVALENT 25 MCG/0.5ML IJ INJ: 0.5000 mL | INJECTION | INTRAMUSCULAR | Status: DC

## 2019-02-11 MED ORDER — ENOXAPARIN SODIUM 30 MG/0.3ML ~~LOC~~ SOLN
30.0000 mg | SUBCUTANEOUS | Status: DC
  Administered 2019-02-12: 30 mg via SUBCUTANEOUS
  Filled 2019-02-11: qty 0.3

## 2019-02-11 MED ORDER — SODIUM CHLORIDE 0.9 % IV SOLN
1.0000 g | INTRAVENOUS | Status: DC
  Administered 2019-02-11 – 2019-02-12 (×2): 1 g via INTRAVENOUS
  Filled 2019-02-11 (×2): qty 10

## 2019-02-11 MED ORDER — SODIUM CHLORIDE 0.9 % IV SOLN
INTRAVENOUS | Status: DC
  Administered 2019-02-11: 03:00:00 via INTRAVENOUS

## 2019-02-11 MED ORDER — ONDANSETRON HCL 4 MG/2ML IJ SOLN
4.0000 mg | Freq: Four times a day (QID) | INTRAMUSCULAR | Status: DC | PRN
  Administered 2019-02-11 – 2019-02-12 (×3): 4 mg via INTRAVENOUS
  Filled 2019-02-11 (×3): qty 2

## 2019-02-11 MED ORDER — INSULIN REGULAR(HUMAN) IN NACL 100-0.9 UT/100ML-% IV SOLN
INTRAVENOUS | Status: DC
  Administered 2019-02-11: 16:00:00 2.5 [IU]/h via INTRAVENOUS
  Administered 2019-02-11: 03:00:00 18.4 [IU]/h via INTRAVENOUS
  Filled 2019-02-11 (×2): qty 100

## 2019-02-11 MED ORDER — SODIUM CHLORIDE 0.9 % IV BOLUS
1000.0000 mL | Freq: Once | INTRAVENOUS | Status: AC
  Administered 2019-02-11: 20:00:00 1000 mL via INTRAVENOUS

## 2019-02-11 MED ORDER — DEXTROSE-NACL 5-0.45 % IV SOLN
INTRAVENOUS | Status: DC
  Administered 2019-02-11: 09:00:00 via INTRAVENOUS

## 2019-02-11 MED ORDER — KCL IN DEXTROSE-NACL 40-5-0.45 MEQ/L-%-% IV SOLN
INTRAVENOUS | Status: DC
  Administered 2019-02-11 – 2019-02-12 (×3): via INTRAVENOUS

## 2019-02-11 MED ORDER — ENOXAPARIN SODIUM 40 MG/0.4ML ~~LOC~~ SOLN
40.0000 mg | SUBCUTANEOUS | Status: DC
  Administered 2019-02-11: 03:00:00 40 mg via SUBCUTANEOUS
  Filled 2019-02-11: qty 0.4

## 2019-02-11 NOTE — ED Notes (Signed)
CRITICAL VALUE ALERT  Critical Value:  Glucose 712  Date & Time Notied: 02/11/2019 0110  Provider Notified: Eleonore Chiquito, MD  Orders Received/Actions taken: n/a

## 2019-02-11 NOTE — Plan of Care (Signed)
Discussed with patient plan of care for the evening, pain management and discussed discontinuing insulin gtt with some teach back displayed.

## 2019-02-11 NOTE — ED Notes (Signed)
CRITICAL VALUE ALERT  Critical Value:  PCO2 11.6  Date & Time Notied:  02/11/2019 0130  Provider Notified: MD Darrick Meigs   Orders Received/Actions taken: No orders at this time

## 2019-02-11 NOTE — Progress Notes (Signed)
Patient Demographics:    Alicia Andrews, is a 63 y.o. female, DOB - Dec 31, 1955, FOY:[id number]  Admit date - 02/10/2019   Admitting Physician Oswald Hillock, MD  Outpatient Primary MD for the patient is [name], [place]  LOS - 1   Chief Complaint  Patient presents with  . Emesis  . Hyperglycemia        Subjective:    Alicia Andrews today has no fevers, no emesis,  No chest pain,     Assessment  & Plan :    Active Problems:   DKA (diabetic ketoacidoses) [facility])  Brief summary 63 year old female with past medical history relevant for DM2 admitted on 02/10/2019 with DKA with blood glucose of 1030, anion gap of 41 and bicarb of 5. PTA patient was taking metformin 1 g twice daily as well as 70/30 insulin 26 units a.m. and 16 units nightly... But she has not taken her insulin or metformin since 02/07/2019 due to vomiting  AP 1)DKA--- on admission blood glucose was 1030, anion gap was 41 bicarb was 5, please see brief summary above--- continue IV insulin drip, bicarb is now off 21 and anion gap is down to 10 with blood sugar of 246--- continue to hydrate aggressively.... Adjust IV fluid rate and composition based on clinical response and lab data  2) possible UTI--- continue IV Rocephin pending culture results  3) recurrent emesis--- improved, hydrate iv and po as above, replace electrolytes, advance diet as tolerated  4)Leukocytosis--most likely reactive in the setting of significant/severe DKA, cannot rule out some component of UTI contributing to leukocytosis  5)Acute Anemia--- anticipate further drop in H&H due to aggressive hydration with resulting hemodilution  Disposition/Need for in-Hospital Stay- patient unable to be discharged at this time due to diabetic ketoacidosis requiring IV insulin and IV fluids and electrolyte replacements  Code Status : Full  Family Communication:   NA (patient  is alert, awake and coherent)   Disposition Plan  : home  Consults  :  na  DVT Prophylaxis  :  Lovenox - SCDs *  Lab Results  Component Value Date   PLT 366 02/11/2019    Inpatient Medications  Scheduled Meds: . [START ON 02/12/2019] enoxaparin (LOVENOX) injection  30 mg Subcutaneous Q24H  . [START ON 02/12/2019] pneumococcal 23 valent vaccine  0.5 mL Intramuscular Tomorrow-1000   Continuous Infusions: . sodium chloride Stopped (02/11/19 1056)  . cefTRIAXone (ROCEPHIN)  IV    . dextrose 5 % and 0.45 % NaCl with KCl 40 mEq/L 100 mL/hr at 02/11/19 1612  . dextrose 5 % and 0.45% NaCl Stopped (02/11/19 1613)  . insulin 4.2 Units/hr (02/11/19 1715)   PRN Meds:.ondansetron (ZOFRAN) IV    Anti-infectives (From admission, onward)   Start     Dose/Rate Route Frequency Ordered Stop   02/11/19 2000  cefTRIAXone (ROCEPHIN) 1 g in sodium chloride 0.9 % 100 mL IVPB     1 g 200 mL/hr over 30 Minutes Intravenous Every 24 hours 02/11/19 0141     02/10/19 2145  cefTRIAXone (ROCEPHIN) 1 g in sodium chloride 0.9 % 100 mL IVPB     1 g 200 mL/hr over 30 Minutes Intravenous  Once 02/10/19 2143 02/11/19 0015  Objective:   Vitals:   02/11/19 1445 02/11/19 1500 02/11/19 1515 02/11/19 1530  BP: (!) 98/55 (!) 103/52 (!) 96/52 (!) 100/56  Pulse: (!) 109 (!) 110 (!) 111 (!) 110  Resp: 10 11 (!) 21 16  Temp:      TempSrc:      SpO2: 99% 99% 100% 98%  Weight:      Height:        Wt Readings from Last 3 Encounters:  02/11/19 54 kg     Intake/Output Summary (Last 24 hours) at 02/11/2019 1721 Last data filed at 02/11/2019 1400 Gross per 24 hour  Intake 4240.69 ml  Output -  Net 4240.69 ml     Physical Exam  Gen:- Awake Alert,  In no apparent distress  HEENT:- [place].AT, No sclera icterus Mouth--- dry oral mucosa Neck-Supple Neck,No JVD,.  Lungs-  CTAB , fair symmetrical air movement CV- S1, S2 normal, regular  Abd-  +ve B.Sounds, Abd Soft, epigastric discomfort without  rebound or guarding,    Extremity/Skin:- No  edema, pedal pulses present , decreased skin turgor Psych-affect is appropriate, oriented x3 Neuro-generalized weakness, but no new focal deficits, no tremors   Data Review:   Micro Results Recent Results (from the past 240 hour(s))  SARS Coronavirus 2 (CEPHEID- Performed in [facility] hospital lab), Hosp Order     Status: None   Collection Time: 02/10/19  9:10 PM   Specimen: Nasopharyngeal Swab  Result Value Ref Range Status   SARS Coronavirus 2 NEGATIVE NEGATIVE Final    Comment: (NOTE) If result is NEGATIVE SARS-CoV-2 target nucleic acids are NOT DETECTED. The SARS-CoV-2 RNA is generally detectable in upper and lower  respiratory specimens during the acute phase of infection. The lowest  concentration of SARS-CoV-2 viral copies this assay can detect is 250  copies / mL. A negative result does not preclude SARS-CoV-2 infection  and should not be used as the sole basis for treatment or other  patient management decisions.  A negative result may occur with  improper specimen collection / handling, submission of specimen other  than nasopharyngeal swab, presence of viral mutation(s) within the  areas targeted by this assay, and inadequate number of viral copies  (<250 copies / mL). A negative result must be combined with clinical  observations, patient history, and epidemiological information. If result is POSITIVE SARS-CoV-2 target nucleic acids are DETECTED. The SARS-CoV-2 RNA is generally detectable in upper and lower  respiratory specimens dur ing the acute phase of infection.  Positive  results are indicative of active infection with SARS-CoV-2.  Clinical  correlation with patient history and other diagnostic information is  necessary to determine patient infection status.  Positive results do  not rule out bacterial infection or co-infection with other viruses. If result is PRESUMPTIVE POSTIVE SARS-CoV-2 nucleic acids MAY BE  PRESENT.   A presumptive positive result was obtained on the submitted specimen  and confirmed on repeat testing.  While 2019 novel coronavirus  (SARS-CoV-2) nucleic acids may be present in the submitted sample  additional confirmatory testing may be necessary for epidemiological  and / or clinical management purposes  to differentiate between  SARS-CoV-2 and other Sarbecovirus currently known to infect humans.  If clinically indicated additional testing with an alternate test  methodology [phone]([id number]) is advised. The SARS-CoV-2 RNA is generally  detectable in upper and lower respiratory sp ecimens during the acute  phase of infection. The expected result is Negative. Fact Sheet for Patients:  BoilerBrush.com.cyhttps://www.fda.gov/media/136312/download Fact Sheet  for Healthcare Providers: https://pope.com/https://www.fda.gov/media/136313/download This test is not yet approved or cleared by the [place] FDA and has been authorized for detection and/or diagnosis of SARS-CoV-2 by FDA under an Emergency Use Authorization (EUA).  This EUA will remain in effect (meaning this test can be used) for the duration of the COVID-19 declaration under Section 564(b)(1) of the Act, 21 U.S.C. section 360bbb-3(b)(1), unless the authorization is terminated or revoked sooner. Performed at [facility], [address] [place], [place] [address]   Blood Culture (routine x 2)     Status: None (Preliminary result)   Collection Time: 02/10/19 10:10 PM   Specimen: BLOOD LEFT FOREARM  Result Value Ref Range Status   Specimen Description BLOOD LEFT FOREARM  Final   Special Requests   Final    BOTTLES DRAWN AEROBIC AND ANAEROBIC Blood Culture adequate volume   Culture   Final    NO GROWTH < 12 HOURS Performed at [facility], [address] [place], [place] [address]    Report Status PENDING  Incomplete  Blood Culture (routine x 2)     Status: None (Preliminary result)   Collection Time: 02/10/19 10:20 PM   Specimen: Left Antecubital;  Blood  Result Value Ref Range Status   Specimen Description LEFT ANTECUBITAL  Final   Special Requests   Final    BOTTLES DRAWN AEROBIC AND ANAEROBIC Blood Culture adequate volume   Culture   Final    NO GROWTH < 12 HOURS Performed at [facility], [address] [place], [place] [address]    Report Status PENDING  Incomplete  MRSA PCR Screening     Status: None   Collection Time: 02/11/19 11:26 AM   Specimen: Nasopharyngeal  Result Value Ref Range Status   MRSA by PCR NEGATIVE NEGATIVE Final    Comment:        The GeneXpert MRSA Assay (FDA approved for NASAL specimens only), is one component of a comprehensive MRSA colonization surveillance program. It is not intended to diagnose MRSA infection nor to guide or monitor treatment for MRSA infections. Performed at [facility], [address] [place], [place] [address]     Radiology Reports Dg Chest Portable 1 View  Result Date: 02/10/2019 CLINICAL DATA:  Nausea, vomiting, diarrhea beginning yesterday at 1700 hours, hyperglycemia, semi responsive, pain, history type II diabetes mellitus EXAM: PORTABLE CHEST 1 VIEW COMPARISON:  Portable exam 2111 hours without priors for comparison FINDINGS: Normal heart size, mediastinal contours, and pulmonary vascularity. Lungs clear. No pleural effusion or pneumothorax. Bones unremarkable. IMPRESSION: No acute abnormalities. Electronically Signed   By: Ulyses SouthwardMark  Boles M.D.   On: 02/10/2019 21:40     CBC Recent Labs  Lab 02/10/19 2107 02/10/19 2209 02/11/19 0400  WBC 40.7* 39.3* 34.8*  HGB 11.9* 10.5* 10.5*  HCT 41.1 36.0 33.0*  PLT 580* 522* 366  MCV 106.5* 105.9* 97.9  MCH 30.8 30.9 31.2  MCHC 29.0* 29.2* 31.8  RDW 13.7 13.6 13.3  LYMPHSABS 2.1 2.5  --   MONOABS 3.7* 1.7*  --   EOSABS 0.1 0.1  --   BASOSABS 0.4* 0.3*  --     Chemistries  Recent Labs  Lab 02/10/19 2107 02/11/19 0022 02/11/19 0400 02/11/19 0808 02/11/19 1142 02/11/19 1615  NA 134* 139 143 143 142 140  K  5.1 3.9 3.6 3.4* 3.3* 3.3*  CL 88* 102 109 108 110 109  CO2 5* 4* 9* 15* 20* 21*  GLUCOSE 1,030* 712* 454* 201* 141* 246*  BUN  46* 40* 38* 39* 35* 31*  CREATININE 2.62* 2.10* 1.91* 1.64* 1.29* 1.11*  CALCIUM 9.0 7.6* 7.7* 7.9* 7.6* 7.3*  MG  --  1.8  --   --   --   --   AST 14*  --   --   --   --   --   ALT 14  --   --   --   --   --   ALKPHOS 144*  --   --   --   --   --   BILITOT 2.4*  --   --   --   --   --    ------------------------------------------------------------------------------------------------------------------ No results for input(s): CHOL, HDL, LDLCALC, TRIG, CHOLHDL, LDLDIRECT in the last 72 hours.  No results found for: HGBA1C ------------------------------------------------------------------------------------------------------------------ No results for input(s): TSH, T4TOTAL, T3FREE, THYROIDAB in the last 72 hours.  Invalid input(s): FREET3 ------------------------------------------------------------------------------------------------------------------ No results for input(s): VITAMINB12, FOLATE, FERRITIN, TIBC, IRON, RETICCTPCT in the last 72 hours.  Coagulation profile No results for input(s): INR, PROTIME in the last 168 hours.  No results for input(s): DDIMER in the last 72 hours.  Cardiac Enzymes No results for input(s): CKMB, TROPONINI, MYOGLOBIN in the last 168 hours.  Invalid input(s): CK ------------------------------------------------------------------------------------------------------------------ No results found for: BNP   Shon Haleourage Karesha Trzcinski M.D on 02/11/2019 at 5:21 PM  Go to [email or web address] - for contact info  [facility] - Office  [phone]

## 2019-02-11 NOTE — ED Notes (Signed)
Date and time results received: 02/11/19 0103   Test: Lactic Critical Value: 2.1  Name of Provider Notified: Rancour, MD

## 2019-02-11 NOTE — ED Notes (Signed)
CRITICAL VALUE ALERT  Critical Value:  Ph 7.071  Date & Time Notied:  02/11/2019 0128  Provider Notified: MD Darrick Meigs   Orders Received/Actions taken: No orders at this time

## 2019-02-11 NOTE — ED Notes (Signed)
Patient still on insulin gtt. No complaints at this time. A/O x4.

## 2019-02-11 NOTE — ED Notes (Signed)
Pt conscious and alert x's4. Pt able to communicate with nurse about concerns. Pt also spoke to her family on the phone.

## 2019-02-12 DIAGNOSIS — N179 Acute kidney failure, unspecified: Secondary | ICD-10-CM | POA: Diagnosis present

## 2019-02-12 DIAGNOSIS — R1115 Cyclical vomiting syndrome unrelated to migraine: Secondary | ICD-10-CM | POA: Diagnosis present

## 2019-02-12 DIAGNOSIS — D72829 Elevated white blood cell count, unspecified: Secondary | ICD-10-CM | POA: Diagnosis present

## 2019-02-12 DIAGNOSIS — E1110 Type 2 diabetes mellitus with ketoacidosis without coma: Secondary | ICD-10-CM | POA: Diagnosis present

## 2019-02-12 LAB — GLUCOSE, CAPILLARY
Glucose-Capillary: 120 mg/dL — ABNORMAL HIGH (ref 70–99)
Glucose-Capillary: 131 mg/dL — ABNORMAL HIGH (ref 70–99)
Glucose-Capillary: 132 mg/dL — ABNORMAL HIGH (ref 70–99)
Glucose-Capillary: 139 mg/dL — ABNORMAL HIGH (ref 70–99)
Glucose-Capillary: 140 mg/dL — ABNORMAL HIGH (ref 70–99)
Glucose-Capillary: 146 mg/dL — ABNORMAL HIGH (ref 70–99)
Glucose-Capillary: 149 mg/dL — ABNORMAL HIGH (ref 70–99)
Glucose-Capillary: 152 mg/dL — ABNORMAL HIGH (ref 70–99)
Glucose-Capillary: 152 mg/dL — ABNORMAL HIGH (ref 70–99)
Glucose-Capillary: 167 mg/dL — ABNORMAL HIGH (ref 70–99)
Glucose-Capillary: 174 mg/dL — ABNORMAL HIGH (ref 70–99)
Glucose-Capillary: 175 mg/dL — ABNORMAL HIGH (ref 70–99)
Glucose-Capillary: 186 mg/dL — ABNORMAL HIGH (ref 70–99)
Glucose-Capillary: 207 mg/dL — ABNORMAL HIGH (ref 70–99)
Glucose-Capillary: 211 mg/dL — ABNORMAL HIGH (ref 70–99)
Glucose-Capillary: 230 mg/dL — ABNORMAL HIGH (ref 70–99)
Glucose-Capillary: 230 mg/dL — ABNORMAL HIGH (ref 70–99)
Glucose-Capillary: 234 mg/dL — ABNORMAL HIGH (ref 70–99)

## 2019-02-12 LAB — CBC
HCT: 32.4 % — ABNORMAL LOW (ref 36.0–46.0)
Hemoglobin: 10.7 g/dL — ABNORMAL LOW (ref 12.0–15.0)
MCH: 30.9 pg (ref 26.0–34.0)
MCHC: 33 g/dL (ref 30.0–36.0)
MCV: 93.6 fL (ref 80.0–100.0)
Platelets: 267 10*3/uL (ref 150–400)
RBC: 3.46 MIL/uL — ABNORMAL LOW (ref 3.87–5.11)
RDW: 14.4 % (ref 11.5–15.5)
WBC: 21.4 10*3/uL — ABNORMAL HIGH (ref 4.0–10.5)
nRBC: 0 % (ref 0.0–0.2)

## 2019-02-12 LAB — BASIC METABOLIC PANEL
Anion gap: 14 (ref 5–15)
Anion gap: 8 (ref 5–15)
BUN: 20 mg/dL (ref 8–23)
BUN: 13 mg/dL (ref 8–23)
CO2: 16 mmol/L — ABNORMAL LOW (ref 22–32)
CO2: 20 mmol/L — ABNORMAL LOW (ref 22–32)
Calcium: 7.3 mg/dL — ABNORMAL LOW (ref 8.9–10.3)
Calcium: 7.5 mg/dL — ABNORMAL LOW (ref 8.9–10.3)
Chloride: 111 mmol/L (ref 98–111)
Chloride: 110 mmol/L (ref 98–111)
Creatinine, Ser: 0.8 mg/dL (ref 0.44–1.00)
Creatinine, Ser: 0.66 mg/dL (ref 0.44–1.00)
GFR calc Af Amer: >60 mL/min (ref >60)
GFR calc Af Amer: >60 mL/min (ref >60)
GFR calc non Af Amer: >60 mL/min (ref >60)
GFR calc non Af Amer: >60 mL/min (ref >60)
Glucose, Bld: 231 mg/dL — ABNORMAL HIGH (ref 70–99)
Glucose, Bld: 154 mg/dL — ABNORMAL HIGH (ref 70–99)
Potassium: 3.5 mmol/L (ref 3.5–5.1)
Potassium: 3.3 mmol/L — ABNORMAL LOW (ref 3.5–5.1)
Sodium: 141 mmol/L (ref 135–145)
Sodium: 138 mmol/L (ref 135–145)

## 2019-02-12 LAB — HIV ANTIBODY (ROUTINE TESTING W REFLEX): HIV Screen 4th Generation wRfx: NONREACTIVE

## 2019-02-12 LAB — COMPREHENSIVE METABOLIC PANEL
ALT: 13 U/L (ref 0–44)
AST: 30 U/L (ref 15–41)
Albumin: 2.9 g/dL — ABNORMAL LOW (ref 3.5–5.0)
Alkaline Phosphatase: 82 U/L (ref 38–126)
Anion gap: 11 (ref 5–15)
BUN: 18 mg/dL (ref 8–23)
CO2: 16 mmol/L — ABNORMAL LOW (ref 22–32)
Calcium: 7.3 mg/dL — ABNORMAL LOW (ref 8.9–10.3)
Chloride: 110 mmol/L (ref 98–111)
Creatinine, Ser: 0.8 mg/dL (ref 0.44–1.00)
GFR calc Af Amer: >60 mL/min (ref >60)
GFR calc non Af Amer: >60 mL/min (ref >60)
Glucose, Bld: 264 mg/dL — ABNORMAL HIGH (ref 70–99)
Potassium: 3.3 mmol/L — ABNORMAL LOW (ref 3.5–5.1)
Sodium: 137 mmol/L (ref 135–145)
Total Bilirubin: 0.6 mg/dL (ref 0.3–1.2)
Total Protein: 5.8 g/dL — ABNORMAL LOW (ref 6.5–8.1)

## 2019-02-12 LAB — URINE CULTURE: Culture: NO GROWTH

## 2019-02-12 LAB — HEMOGLOBIN A1C
Hgb A1c MFr Bld: 13 % — ABNORMAL HIGH (ref 4.8–5.6)
Mean Plasma Glucose: 326.4 mg/dL

## 2019-02-12 MED ORDER — PROMETHAZINE HCL 25 MG/ML IJ SOLN
12.5000 mg | Freq: Once | INTRAMUSCULAR | Status: AC
  Administered 2019-02-12: 03:00:00 12.5 mg via INTRAVENOUS
  Filled 2019-02-12: qty 1

## 2019-02-12 MED ORDER — INSULIN ASPART 100 UNIT/ML ~~LOC~~ SOLN
0.0000 [IU] | Freq: Three times a day (TID) | SUBCUTANEOUS | Status: DC
  Administered 2019-02-13: 3 [IU] via SUBCUTANEOUS

## 2019-02-12 MED ORDER — CHLORHEXIDINE GLUCONATE CLOTH 2 % EX PADS
6.0000 | MEDICATED_PAD | Freq: Every day | CUTANEOUS | Status: DC
  Administered 2019-02-12 – 2019-02-13 (×2): 6 via TOPICAL

## 2019-02-12 MED ORDER — ENOXAPARIN SODIUM 40 MG/0.4ML ~~LOC~~ SOLN
40.0000 mg | SUBCUTANEOUS | Status: DC
  Administered 2019-02-13: 05:00:00 40 mg via SUBCUTANEOUS
  Filled 2019-02-12: qty 0.4

## 2019-02-12 MED ORDER — INSULIN ASPART PROT & ASPART (70-30 MIX) 100 UNIT/ML ~~LOC~~ SUSP
22.0000 [IU] | Freq: Two times a day (BID) | SUBCUTANEOUS | Status: DC
  Administered 2019-02-12 – 2019-02-13 (×2): 22 [IU] via SUBCUTANEOUS
  Filled 2019-02-12: qty 10

## 2019-02-12 MED ORDER — SODIUM CHLORIDE 0.9 % IV SOLN
INTRAVENOUS | Status: DC | PRN
  Administered 2019-02-12: 500 mL via INTRAVENOUS

## 2019-02-12 MED ORDER — PHENOL 1.4 % MT LIQD
1.0000 | Freq: Four times a day (QID) | OROMUCOSAL | Status: DC | PRN
  Administered 2019-02-12: 20:00:00 1 via OROMUCOSAL
  Filled 2019-02-12: qty 177

## 2019-02-12 MED ORDER — PNEUMOCOCCAL VAC POLYVALENT 25 MCG/0.5ML IJ INJ: 0.5000 mL | INJECTION | INTRAMUSCULAR | Status: DC

## 2019-02-12 MED ORDER — INSULIN ASPART 100 UNIT/ML ~~LOC~~ SOLN: 0.0000 [IU] | Freq: Every day | SUBCUTANEOUS | Status: DC

## 2019-02-12 MED ORDER — ONDANSETRON HCL 4 MG/2ML IJ SOLN
4.0000 mg | INTRAMUSCULAR | Status: DC | PRN
  Administered 2019-02-12: 4 mg via INTRAVENOUS
  Filled 2019-02-12: qty 2

## 2019-02-12 NOTE — Progress Notes (Signed)
Inpatient Diabetes Program Recommendations  AACE/ADA: New Consensus Statement on Inpatient Glycemic Control   Target Ranges:  Prepandial:   less than 140 mg/dL      Peak postprandial:   less than 180 mg/dL (1-2 hours)      Critically ill patients:  140 - 180 mg/dL  Results for Alicia Andrews, Alicia Andrews (MRN [id number]) as of 02/12/2019 12:53  Ref. Range 02/12/2019 07:45 02/12/2019 08:34 02/12/2019 09:31 02/12/2019 10:24 02/12/2019 10:41 02/12/2019 11:42 02/12/2019 12:47  Glucose-Capillary Latest Ref Range: 70 - 99 mg/dL 207 (H) 186 (H) 152 (H) 146 (H) 152 (H) 139 (H) 132 (H)   Results for Alicia Andrews, Alicia Andrews (MRN [id number]) as of 02/12/2019 12:53  Ref. Range 02/12/2019 05:53  CO2 Latest Ref Range: 22 - 32 mmol/L 16 (L)  Results for Alicia Andrews, Alicia Andrews (MRN [id number]) as of 02/12/2019 12:53  Ref. Range 02/12/2019 05:53  Anion gap Latest Ref Range: 5 - 15  11   Review of Glycemic Control  Diabetes history: DM2 Outpatient Diabetes medications: 70/30 26 units QAM, 70/30 16 units QPM, Metformin XR 1000 mg BID Current orders for Inpatient glycemic control: IV insulin drip per DKA  Inpatient Diabetes Program Recommendations:   IV insulin drip: If patient is being allowed to eat while on IV insulin drip, please order Novolog 0-10 units TID IV (1 unit for every 10 grams of carbs) to cover carbohydrates consumed.  Labs:  May want to consider ordering beta-hydroxybutyric acid.  Insulin at transition off IV insulin drip: Once MD is ready to transition from IV to SQ insulin, please consider ordering Lantus 10 units Q24H (based on 52 kg x 0.2 units), CBGs Q4H, Novolog 0-9 units Q4H, and Novolog 3 units TID with meals for meal coverage if patient eats at least 50% of meals.  HgbA1C: Noted in Care Everywhere, A1C 11.6% on 09/19/18.  Thanks, Barnie Alderman, RN, MSN, CDE Diabetes Coordinator Inpatient Diabetes Program [phone] (Team Pager from 8am to 5pm)

## 2019-02-12 NOTE — Progress Notes (Addendum)
Patient Demographics:    Alicia Andrews, is a 63 y.o. female, DOB - 08-May-1956, [id number]  Admit date - 02/10/2019   Admitting Physician Meredeth IdeGagan S Lama, MD  Outpatient Primary MD for the patient is Alicia Andrews, Teresa, FNP  LOS - 2   Chief Complaint  Patient presents with  . Emesis  . Hyperglycemia        Subjective:    Alicia ChafeRebecca Mcauliff today has no fevers, nausea but no emesis,  No chest pain,   ..... No BM, no dysuria, no flank pain  Assessment  & Plan :    Principal Problem:   DKA (diabetic ketoacidoses) (HCC) Active Problems:   DM (diabetes mellitus) type 2, uncontrolled, with ketoacidosis (HCC)   Leukocytosis   Emesis, persistent   AKI (acute kidney injury) [facility](HCC)  Brief summary 63 year old female with past medical history relevant for DM2 admitted on 02/10/2019 with DKA with blood glucose of 1030, anion gap of 41 and bicarb of 5. PTA patient was taking metformin 1 g twice daily as well as 70/30 insulin 26 units a.m. and 16 units nightly... But she has not taken her insulin or metformin since 02/07/2019 due to vomiting , wBC on admission was 40.7  AP 1)DKA--- on admission blood glucose was 1030, anion gap was 41 bicarb was 5, please see brief summary above--- continue IV insulin drip, bicarb is now 16,  anion gap is down to 11 with blood sugar of mid 200s-- continue to hydrate aggressively.... Adjust IV fluid rate and composition based on clinical response and lab data...Marland Kitchen.Marland Kitchen.Marland Kitchen. repeat BMP and she may be able to come off IV insulin drip if bicarb normalizes ---PTA patient was taking metformin 1 g twice daily as well as 70/30 insulin 26 units a.m. and 16 units nightly  2) leukocytosis--- on admission WBC was 40.7 down to 21.4 this time, ???  Reactive in the setting of DKA versus infection related, urine culture NGTD, may discontinue IV Rocephin on 02/13/2019 if blood cultures remain negative   3)Recurrent emesis--- improved, hydrate iv and po as above, replace electrolytes, advance diet as tolerated  4) Acute Anemia--- Hgb is down to 10.7 from 11.9 on admission --- suspect that this is due to aggressive hydration with resulting hemodilution... No evidence of ongoing bleeding  5)AKI----acute kidney injury due to dehydration in the setting of recurrent emesis and DKA , renal function continues to improve with hydration.    Creatinine on admission=2.62 ,   baseline creatinine = 0.6    , creatinine is now= 0.8     , renally adjust medications, avoid nephrotoxic agents/dehydration/hypotension   Disposition/Need for in-Hospital Stay- patient unable to be discharged at this time due to diabetic ketoacidosis requiring IV insulin drip and IV fluids and electrolyte replacements  Code Status : Full  Family Communication:   NA (patient is alert, awake and coherent)   Disposition Plan  : home  Consults  :  na  DVT Prophylaxis  :  Lovenox - SCDs *  Lab Results  Component Value Date   PLT 267 02/12/2019    Inpatient Medications  Scheduled Meds: . Chlorhexidine Gluconate Cloth  6 each Topical Q0600  . [START ON 02/13/2019] enoxaparin (LOVENOX) injection  40 mg Subcutaneous Q24H  . [START  ON 02/13/2019] pneumococcal 23 valent vaccine  0.5 mL Intramuscular Tomorrow-1000   Continuous Infusions: . sodium chloride Stopped (02/11/19 1056)  . cefTRIAXone (ROCEPHIN)  IV Stopped (02/11/19 2100)  . dextrose 5 % and 0.45 % NaCl with KCl 40 mEq/L 125 mL/hr (02/12/19 0813)  . insulin 3.5 Units/hr (02/12/19 0634)   PRN Meds:.ondansetron (ZOFRAN) IV    Anti-infectives (From admission, onward)   Start     Dose/Rate Route Frequency Ordered Stop   02/11/19 2000  cefTRIAXone (ROCEPHIN) 1 g in sodium chloride 0.9 % 100 mL IVPB     1 g 200 mL/hr over 30 Minutes Intravenous Every 24 hours 02/11/19 0141     02/10/19 2145  cefTRIAXone (ROCEPHIN) 1 g in sodium chloride 0.9 % 100 mL IVPB     1 g 200  mL/hr over 30 Minutes Intravenous  Once 02/10/19 2143 02/11/19 0015        Objective:   Vitals:   02/12/19 0545 02/12/19 0600 02/12/19 0615 02/12/19 0630  BP: (!) 101/50 (!) 108/55 102/63 111/65  Pulse: (!) 117 (!) 113 (!) 106 (!) 110  Resp: 15 17 14 16   Temp:      TempSrc:      SpO2: 97% 100% 99% 99%  Weight:      Height:        Wt Readings from Last 3 Encounters:  02/12/19 52 kg     Intake/Output Summary (Last 24 hours) at 02/12/2019 0925 Last data filed at 02/12/2019 0505 Gross per 24 hour  Intake 3460.99 ml  Output 850 ml  Net 2610.99 ml    Physical Exam  Gen:- Awake Alert,  In no apparent distress  HEENT:- [place].AT, No sclera icterus Mouth--- much less dry oral mucosa Neck-Supple Neck,No JVD,.  Lungs-  CTAB , fair symmetrical air movement CV- S1, S2 normal, regular  Abd-  +ve B.Sounds, Abd Soft, epigastric discomfort without rebound or guarding,    Extremity/Skin:- No  edema, pedal pulses present , improved skin turgor Psych-affect is appropriate, oriented x3 Neuro-generalized weakness, but no new focal deficits, no tremors   Data Review:   Micro Results Recent Results (from the past 240 hour(s))  SARS Coronavirus 2 (CEPHEID- Performed in [place] hospital lab), Hosp Order     Status: None   Collection Time: 02/10/19  9:10 PM   Specimen: Nasopharyngeal Swab  Result Value Ref Range Status   SARS Coronavirus 2 NEGATIVE NEGATIVE Final    Comment: (NOTE) If result is NEGATIVE SARS-CoV-2 target nucleic acids are NOT DETECTED. The SARS-CoV-2 RNA is generally detectable in upper and lower  respiratory specimens during the acute phase of infection. The lowest  concentration of SARS-CoV-2 viral copies this assay can detect is 250  copies / mL. A negative result does not preclude SARS-CoV-2 infection  and should not be used as the sole basis for treatment or other  patient management decisions.  A negative result may occur with  improper specimen collection /  handling, submission of specimen other  than nasopharyngeal swab, presence of viral mutation(s) within the  areas targeted by this assay, and inadequate number of viral copies  (<250 copies / mL). A negative result must be combined with clinical  observations, patient history, and epidemiological information. If result is POSITIVE SARS-CoV-2 target nucleic acids are DETECTED. The SARS-CoV-2 RNA is generally detectable in upper and lower  respiratory specimens dur ing the acute phase of infection.  Positive  results are indicative of active infection with SARS-CoV-2.  Clinical  correlation with patient history and other diagnostic information is  necessary to determine patient infection status.  Positive results do  not rule out bacterial infection or co-infection with other viruses. If result is PRESUMPTIVE POSTIVE SARS-CoV-2 nucleic acids MAY BE PRESENT.   A presumptive positive result was obtained on the submitted specimen  and confirmed on repeat testing.  While 2019 novel coronavirus  (SARS-CoV-2) nucleic acids may be present in the submitted sample  additional confirmatory testing may be necessary for epidemiological  and / or clinical management purposes  to differentiate between  SARS-CoV-2 and other Sarbecovirus currently known to infect humans.  If clinically indicated additional testing with an alternate test  methodology [phone]) is advised. The SARS-CoV-2 RNA is generally  detectable in upper and lower respiratory sp ecimens during the acute  phase of infection. The expected result is Negative. Fact Sheet for Patients:  BoilerBrush.com.cyhttps://www.fda.gov/media/136312/download Fact Sheet for Healthcare Providers: https://pope.com/https://www.fda.gov/media/136313/download This test is not yet approved or cleared by the [place] FDA and has been authorized for detection and/or diagnosis of SARS-CoV-2 by FDA under an Emergency Use Authorization (EUA).  This EUA will remain in effect (meaning this  test can be used) for the duration of the COVID-19 declaration under Section 564(b)(1) of the Act, 21 U.S.C. section 360bbb-3(b)(1), unless the authorization is terminated or revoked sooner. Performed at [facility], [address] [place], [place] [address]   Blood Culture (routine x 2)     Status: None (Preliminary result)   Collection Time: 02/10/19 10:10 PM   Specimen: BLOOD LEFT FOREARM  Result Value Ref Range Status   Specimen Description BLOOD LEFT FOREARM  Final   Special Requests   Final    BOTTLES DRAWN AEROBIC AND ANAEROBIC Blood Culture adequate volume   Culture   Final    NO GROWTH 2 DAYS Performed at [facility], [address] [place], [place] [address]    Report Status PENDING  Incomplete  Blood Culture (routine x 2)     Status: None (Preliminary result)   Collection Time: 02/10/19 10:20 PM   Specimen: Left Antecubital; Blood  Result Value Ref Range Status   Specimen Description LEFT ANTECUBITAL  Final   Special Requests   Final    BOTTLES DRAWN AEROBIC AND ANAEROBIC Blood Culture adequate volume   Culture   Final    NO GROWTH 2 DAYS Performed at [facility], [address] [place], [place] [address]    Report Status PENDING  Incomplete  Urine culture     Status: None   Collection Time: 02/10/19 11:44 PM   Specimen: Urine, Catheterized  Result Value Ref Range Status   Specimen Description   Final    URINE, CATHETERIZED Performed at [facility], [address] [place], [place] [address]    Special Requests   Final    NONE Performed at [facility], [address] [place], [place] [address]    Culture   Final    NO GROWTH Performed at [facility], [address] [place], [place] [address]    Report Status 02/12/2019 FINAL  Final  MRSA PCR Screening     Status: None   Collection Time: 02/11/19 11:26 AM   Specimen: Nasopharyngeal  Result Value Ref Range Status   MRSA by PCR NEGATIVE NEGATIVE Final    Comment:        The GeneXpert MRSA Assay  (FDA approved for NASAL specimens only), is one component of a comprehensive MRSA colonization surveillance program.  It is not intended to diagnose MRSA infection nor to guide or monitor treatment for MRSA infections. Performed at [facility], [address] [place], [place] [address]     Radiology Reports Dg Chest Portable 1 View  Result Date: 02/10/2019 CLINICAL DATA:  Nausea, vomiting, diarrhea beginning yesterday at 1700 hours, hyperglycemia, semi responsive, pain, history type II diabetes mellitus EXAM: PORTABLE CHEST 1 VIEW COMPARISON:  Portable exam 2111 hours without priors for comparison FINDINGS: Normal heart size, mediastinal contours, and pulmonary vascularity. Lungs clear. No pleural effusion or pneumothorax. Bones unremarkable. IMPRESSION: No acute abnormalities. Electronically Signed   By: Ulyses SouthwardMark  Boles M.D.   On: 02/10/2019 21:40     CBC Recent Labs  Lab 02/10/19 2107 02/10/19 2209 02/11/19 0400 02/12/19 0553  WBC 40.7* 39.3* 34.8* 21.4*  HGB 11.9* 10.5* 10.5* 10.7*  HCT 41.1 36.0 33.0* 32.4*  PLT 580* 522* 366 267  MCV 106.5* 105.9* 97.9 93.6  MCH 30.8 30.9 31.2 30.9  MCHC 29.0* 29.2* 31.8 33.0  RDW 13.7 13.6 13.3 14.4  LYMPHSABS 2.1 2.5  --   --   MONOABS 3.7* 1.7*  --   --   EOSABS 0.1 0.1  --   --   BASOSABS 0.4* 0.3*  --   --     Chemistries  Recent Labs  Lab 02/10/19 2107 02/11/19 0022  02/11/19 1142 02/11/19 1615 02/11/19 2139 02/12/19 0215 02/12/19 0553  NA 134* 139   < > 142 140 139 141 137  K 5.1 3.9   < > 3.3* 3.3* 3.0* 3.5 3.3*  CL 88* 102   < > 110 109 112* 111 110  CO2 5* 4*   < > 20* 21* 19* 16* 16*  GLUCOSE 1,030* 712*   < > 141* 246* 96 231* 264*  BUN 46* 40*   < > 35* 31* 25* 20 18  CREATININE 2.62* 2.10*   < > 1.29* 1.11* 0.81 0.80 0.80  CALCIUM 9.0 7.6*   < > 7.6* 7.3* 7.2* 7.3* 7.3*  MG  --  1.8  --   --   --   --   --   --   AST 14*  --   --   --   --   --   --  30  ALT 14  --   --   --   --   --   --  13  ALKPHOS 144*   --   --   --   --   --   --  82  BILITOT 2.4*  --   --   --   --   --   --  0.6   < > = values in this interval not displayed.   ------------------------------------------------------------------------------------------------------------------ No results for input(s): CHOL, HDL, LDLCALC, TRIG, CHOLHDL, LDLDIRECT in the last 72 hours.  No results found for: HGBA1C ------------------------------------------------------------------------------------------------------------------ No results for input(s): TSH, T4TOTAL, T3FREE, THYROIDAB in the last 72 hours.  Invalid input(s): FREET3 ------------------------------------------------------------------------------------------------------------------ No results for input(s): VITAMINB12, FOLATE, FERRITIN, TIBC, IRON, RETICCTPCT in the last 72 hours.  Coagulation profile No results for input(s): INR, PROTIME in the last 168 hours.  No results for input(s): DDIMER in the last 72 hours.  Cardiac Enzymes No results for input(s): CKMB, TROPONINI, MYOGLOBIN in the last 168 hours.  Invalid input(s): CK ------------------------------------------------------------------------------------------------------------------ No results found for: BNP   Shon Haleourage Tiwanda Threats M.D on 02/12/2019 at 9:25 AM  Go to [email or web address] - for contact info  [facility] -  Office  [phone]

## 2019-02-13 DIAGNOSIS — D72829 Elevated white blood cell count, unspecified: Secondary | ICD-10-CM

## 2019-02-13 DIAGNOSIS — R1115 Cyclical vomiting syndrome unrelated to migraine: Secondary | ICD-10-CM

## 2019-02-13 DIAGNOSIS — N179 Acute kidney failure, unspecified: Secondary | ICD-10-CM

## 2019-02-13 LAB — BASIC METABOLIC PANEL
Anion gap: 6 (ref 5–15)
BUN: 7 mg/dL — ABNORMAL LOW (ref 8–23)
CO2: 23 mmol/L (ref 22–32)
Calcium: 7.7 mg/dL — ABNORMAL LOW (ref 8.9–10.3)
Chloride: 111 mmol/L (ref 98–111)
Creatinine, Ser: 0.56 mg/dL (ref 0.44–1.00)
GFR calc Af Amer: >60 mL/min (ref >60)
GFR calc non Af Amer: >60 mL/min (ref >60)
Glucose, Bld: 149 mg/dL — ABNORMAL HIGH (ref 70–99)
Potassium: 3.4 mmol/L — ABNORMAL LOW (ref 3.5–5.1)
Sodium: 140 mmol/L (ref 135–145)

## 2019-02-13 LAB — CBC
HCT: 29.1 % — ABNORMAL LOW (ref 36.0–46.0)
Hemoglobin: 9.7 g/dL — ABNORMAL LOW (ref 12.0–15.0)
MCH: 30.9 pg (ref 26.0–34.0)
MCHC: 33.3 g/dL (ref 30.0–36.0)
MCV: 92.7 fL (ref 80.0–100.0)
Platelets: 209 10*3/uL (ref 150–400)
RBC: 3.14 MIL/uL — ABNORMAL LOW (ref 3.87–5.11)
RDW: 14.6 % (ref 11.5–15.5)
WBC: 9.5 10*3/uL (ref 4.0–10.5)
nRBC: 0 % (ref 0.0–0.2)

## 2019-02-13 LAB — GLUCOSE, CAPILLARY
Glucose-Capillary: 68 mg/dL — ABNORMAL LOW (ref 70–99)
Glucose-Capillary: 93 mg/dL (ref 70–99)
Glucose-Capillary: 149 mg/dL — ABNORMAL HIGH (ref 70–99)
Glucose-Capillary: 47 mg/dL — ABNORMAL LOW (ref 70–99)
Glucose-Capillary: 61 mg/dL — ABNORMAL LOW (ref 70–99)
Glucose-Capillary: 87 mg/dL (ref 70–99)
Glucose-Capillary: 168 mg/dL — ABNORMAL HIGH (ref 70–99)

## 2019-02-13 MED ORDER — ONDANSETRON HCL 4 MG PO TABS: 4.0000 mg | ORAL_TABLET | Freq: Three times a day (TID) | ORAL | 1 refills | Status: AC | PRN

## 2019-02-13 MED ORDER — METFORMIN HCL 1000 MG PO TABS: 1000.0000 mg | ORAL_TABLET | Freq: Two times a day (BID) | ORAL | 11 refills | Status: DC

## 2019-02-13 MED ORDER — NOVOLIN 70/30 RELION (70-30) 100 UNIT/ML ~~LOC~~ SUSP: 25.0000 [IU] | SUBCUTANEOUS | 11 refills | Status: DC

## 2019-02-13 NOTE — Discharge Summary (Signed)
Alicia Andrews, is a 63 y.o. female  DOB 1956/02/16  MRN [id number].  Admission date:  02/10/2019  Admitting Physician  Meredeth IdeGagan S Lama, MD  Discharge Date:  02/13/2019   Primary MD  [name], Teresa, FNP  Recommendations for primary care physician for things to follow:   1)Please check your blood sugars at least 4 times a day until your glucose level more stable 2)Please eat snacks to avoid low blood sugars until your appetite improves enough 3)Your 70/30 insulin has been reduced to 20 units in the morning and 16 units in the evening for now due to your poor oral intake----you can titrate your insulin back up to 26 units in the morning and 16 units as your oral intake improves 4) follow-up to primary care physician within a week for recheck and reevaluation 5) continue to drink, maintain adequate hydration, avoid dehydration  Admission Diagnosis  Diabetic ketoacidosis without coma associated with other specified diabetes mellitus (HCC) [E13.10]   Discharge Diagnosis  Diabetic ketoacidosis without coma associated with other specified diabetes mellitus (HCC) [E13.10]    Principal Problem:   DKA (diabetic ketoacidoses) (HCC) Active Problems:   DM (diabetes mellitus) type 2, uncontrolled, with ketoacidosis (HCC)   Leukocytosis   Emesis, persistent   AKI (acute kidney injury) (HCC)      Past Medical History:  Diagnosis Date  . Diabetes mellitus without complication (HCC)    Type 2     History reviewed. No pertinent surgical history.     HPI  from the history and physical done on the day of admission:    Alicia Andrews  is a 63 y.o. female, with history of diabetes mellitus type 2, came to hospital with complaints of nausea vomiting and diarrhea which began on Wednesday evening.  Patient says that she usually takes insulin 70/30 twice a day, her last dose was on Wednesday.  Patient says that  she was vomiting, and also developed diarrhea.  She complains of dysuria.  Denies abdominal pain. Denies chest pain, shortness of breath, fever or chills. SARS-CoV-2 is negative in the ED. Lab work showed blood glucose of 1031.  Patient was found to be in DKA with anion gap of 41. Creatinine 2.62.  Unknown baseline. ABG showed pH 6.931, PCO2 10.0. Patient started on DKA protocol. Also started on ceftriaxone for possible UTI    Hospital Course:     Brief summary 63 year old female with past medical history relevant for DM2 admitted on 02/10/2019 with DKA with blood glucose of 1030, anion gap of 41 and bicarb of 5. PTA patient was taking metformin 1 g twice daily as well as 70/30 insulin 26 units a.m. and 16 units nightly... But she has not taken her insulin or metformin since 02/07/2019 due to vomiting , wBC on admission was 40.7  AP 1)DKA--- Now Resolved, on admission blood glucose was 1030, anion gap was 41 bicarb was 5, please see brief summary above--- treated with IV insulin drip, bicarb is now 23,  anion gap is down to  6 --hyperglycemia is resolved-  ---PTA patient was taking metformin 1 g twice daily as well as 70/30 insulin 26 units a.m. and 16 units nightly----please see discharge instructions for adjustment of insulin dosage upon discharge  2)Significant leukocytosis--- on admission WBC was 40.7 down to 9.5,  ???  Reactive in the setting of DKA versus infection related, urine culture NGTD,  discontinue IV Rocephin after 02/12/2019 dose as blood cultures remain negative  3)Recurrent emesis--- resolved, oral intake is fair   4) Acute Anemia--- Hgb is down to 9.7 from 11.9 on admission --- suspect that this is due to aggressive hydration with resulting hemodilution... No evidence of ongoing bleeding  5)AKI----acute kidney injury due to dehydration in the setting of recurrent emesis and DKA , renal function continues to improve with hydration.    Creatinine on admission=2.62 ,    baseline creatinine = 0.56    , creatinine is now= 0.8     , renally adjust medications, avoid nephrotoxic agents/dehydration/hypotension  Disposition--- Home  Code Status : Full  Family Communication:   NA (patient is alert, awake and coherent)   Disposition Plan  : home  Consults  :  na  Discharge Condition: stable  Follow UP--- PCP within a week for recheck  Diet and Activity recommendation:  As advised  Discharge Instructions    Discharge Instructions    Call MD for:  difficulty breathing, headache or visual disturbances   Complete by: As directed    Call MD for:  persistant dizziness or light-headedness   Complete by: As directed    Call MD for:  persistant nausea and vomiting   Complete by: As directed    Call MD for:  severe uncontrolled pain   Complete by: As directed    Call MD for:  temperature >100.4   Complete by: As directed    Diet - low sodium heart healthy   Complete by: As directed    Diet Carb Modified   Complete by: As directed    Discharge instructions   Complete by: As directed    1)Please check your blood sugars at least 4 times a day until your glucose level more stable 2)Please eat snacks to avoid low blood sugars until your appetite improves enough 3)Your 70/30 insulin has been reduced to 20 units in the morning and 16 units in the evening for now due to your poor oral intake----you can titrate your insulin back up to 26 units in the morning and 16 units as your oral intake improves 4) follow-up to primary care physician within a week for recheck and reevaluation 5) continue to drink, maintain adequate hydration, avoid dehydration   Increase activity slowly   Complete by: As directed         Discharge Medications     Allergies as of 02/13/2019   Not on File     Medication List    STOP taking these medications   metFORMIN 500 MG 24 hr tablet Commonly known as: GLUCOPHAGE-XR Replaced by: metFORMIN 1000 MG tablet     TAKE  these medications   atorvastatin 10 MG tablet Commonly known as: LIPITOR Take 10 mg by mouth daily.   metFORMIN 1000 MG tablet Commonly known as: Glucophage Take 1 tablet (1,000 mg total) by mouth 2 (two) times daily with a meal. Replaces: metFORMIN 500 MG 24 hr tablet   NovoLIN 70/30 ReliOn (70-30) 100 UNIT/ML injection Generic drug: insulin NPH-regular Human Inject 25-50 Units into the skin See admin instructions. INJECT 30  MINUTES BEFORE BREAKFAST AND SUPPER BASED ON TITRATION GUIDELINE, 18 and 22 UNITS TWICE A DAY WITH MEALs--- What changed:   how to take this  additional instructions   ondansetron 4 MG tablet Commonly known as: Zofran Take 1 tablet (4 mg total) by mouth every 8 (eight) hours as needed for nausea or vomiting.       Major procedures and Radiology Reports - PLEASE review detailed and final reports for all details, in brief -   Dg Chest Portable 1 View  Result Date: 02/10/2019 CLINICAL DATA:  Nausea, vomiting, diarrhea beginning yesterday at 1700 hours, hyperglycemia, semi responsive, pain, history type II diabetes mellitus EXAM: PORTABLE CHEST 1 VIEW COMPARISON:  Portable exam 2111 hours without priors for comparison FINDINGS: Normal heart size, mediastinal contours, and pulmonary vascularity. Lungs clear. No pleural effusion or pneumothorax. Bones unremarkable. IMPRESSION: No acute abnormalities. Electronically Signed   By: Lavonia Dana M.D.   On: 02/10/2019 21:40    Micro Results    Recent Results (from the past 240 hour(s))  SARS Coronavirus 2 (CEPHEID- Performed in [place] hospital lab), Hosp Order     Status: None   Collection Time: 02/10/19  9:10 PM   Specimen: Nasopharyngeal Swab  Result Value Ref Range Status   SARS Coronavirus 2 NEGATIVE NEGATIVE Final    Comment: (NOTE) If result is NEGATIVE SARS-CoV-2 target nucleic acids are NOT DETECTED. The SARS-CoV-2 RNA is generally detectable in upper and lower  respiratory specimens during the  acute phase of infection. The lowest  concentration of SARS-CoV-2 viral copies this assay can detect is 250  copies / mL. A negative result does not preclude SARS-CoV-2 infection  and should not be used as the sole basis for treatment or other  patient management decisions.  A negative result may occur with  improper specimen collection / handling, submission of specimen other  than nasopharyngeal swab, presence of viral mutation(s) within the  areas targeted by this assay, and inadequate number of viral copies  (<250 copies / mL). A negative result must be combined with clinical  observations, patient history, and epidemiological information. If result is POSITIVE SARS-CoV-2 target nucleic acids are DETECTED. The SARS-CoV-2 RNA is generally detectable in upper and lower  respiratory specimens dur ing the acute phase of infection.  Positive  results are indicative of active infection with SARS-CoV-2.  Clinical  correlation with patient history and other diagnostic information is  necessary to determine patient infection status.  Positive results do  not rule out bacterial infection or co-infection with other viruses. If result is PRESUMPTIVE POSTIVE SARS-CoV-2 nucleic acids MAY BE PRESENT.   A presumptive positive result was obtained on the submitted specimen  and confirmed on repeat testing.  While 2019 novel coronavirus  (SARS-CoV-2) nucleic acids may be present in the submitted sample  additional confirmatory testing may be necessary for epidemiological  and / or clinical management purposes  to differentiate between  SARS-CoV-2 and other Sarbecovirus currently known to infect humans.  If clinically indicated additional testing with an alternate test  methodology [phone]) is advised. The SARS-CoV-2 RNA is generally  detectable in upper and lower respiratory sp ecimens during the acute  phase of infection. The expected result is Negative. Fact Sheet for Patients:   StrictlyIdeas.no Fact Sheet for Healthcare Providers: BankingDealers.co.za This test is not yet approved or cleared by the [place] FDA and has been authorized for detection and/or diagnosis of SARS-CoV-2 by FDA under an Emergency Use Authorization (EUA).  This  EUA will remain in effect (meaning this test can be used) for the duration of the COVID-19 declaration under Section 564(b)(1) of the Act, 21 U.S.C. section 360bbb-3(b)(1), unless the authorization is terminated or revoked sooner. Performed at [facility], [address] [place], [place] [address]   Blood Culture (routine x 2)     Status: None (Preliminary result)   Collection Time: 02/10/19 10:10 PM   Specimen: BLOOD LEFT FOREARM  Result Value Ref Range Status   Specimen Description BLOOD LEFT FOREARM  Final   Special Requests   Final    BOTTLES DRAWN AEROBIC AND ANAEROBIC Blood Culture adequate volume   Culture   Final    NO GROWTH 3 DAYS Performed at [facility], [address] [place], [place] [address]    Report Status PENDING  Incomplete  Blood Culture (routine x 2)     Status: None (Preliminary result)   Collection Time: 02/10/19 10:20 PM   Specimen: Left Antecubital; Blood  Result Value Ref Range Status   Specimen Description LEFT ANTECUBITAL  Final   Special Requests   Final    BOTTLES DRAWN AEROBIC AND ANAEROBIC Blood Culture adequate volume   Culture   Final    NO GROWTH 3 DAYS Performed at [facility], [address] [place], [place] [address]    Report Status PENDING  Incomplete  Urine culture     Status: None   Collection Time: 02/10/19 11:44 PM   Specimen: Urine, Catheterized  Result Value Ref Range Status   Specimen Description   Final    URINE, CATHETERIZED Performed at [facility], [address] [place], [place] [address]    Special Requests   Final    NONE Performed at [facility], [address] [place], [place] [address]    Culture    Final    NO GROWTH Performed at [facility], [address] [place], [place] [address]    Report Status 02/12/2019 FINAL  Final  MRSA PCR Screening     Status: None   Collection Time: 02/11/19 11:26 AM   Specimen: Nasopharyngeal  Result Value Ref Range Status   MRSA by PCR NEGATIVE NEGATIVE Final    Comment:        The GeneXpert MRSA Assay (FDA approved for NASAL specimens only), is one component of a comprehensive MRSA colonization surveillance program. It is not intended to diagnose MRSA infection nor to guide or monitor treatment for MRSA infections. Performed at [facility], [address] [place], [place] [address]        Today   Subjective    Alicia Andrews today has no new complaints, Had episode of hypoglycemia this morning due to poor oral intake sugars improved after oral intake          Patient has been seen and examined prior to discharge   Objective   Blood pressure 110/67, pulse 100, temperature 98.8 F (37.1 C), temperature source Oral, resp. rate 13, height 5' (1.524 m), weight 52 kg, SpO2 97 %.   Intake/Output Summary (Last 24 hours) at 02/13/2019 1424 Last data filed at 02/12/2019 2212 Gross per 24 hour  Intake 862.41 ml  Output -  Net 862.41 ml    Exam Gen:- Awake Alert, no acute distress  HEENT:- [place].AT, No sclera icterus Neck-Supple Neck,No JVD,.  Lungs-  CTAB , good air movement bilaterally  CV- S1, S2 normal, regular Abd-  +ve B.Sounds, Abd Soft, No tenderness,    Extremity/Skin:- No  edema,   good pulses Psych-affect is appropriate, oriented x3 Neuro-no new focal deficits, no tremors    Data Review   CBC w Diff:  Lab Results  Component Value Date   WBC 9.5 02/13/2019   HGB 9.7 (L) 02/13/2019   HCT 29.1 (L) 02/13/2019   PLT 209 02/13/2019   LYMPHOPCT 6 02/10/2019   MONOPCT 4 02/10/2019   EOSPCT 0 02/10/2019   BASOPCT 1 02/10/2019    CMP:  Lab Results  Component Value Date   NA 140 02/13/2019   K 3.4 (L)  02/13/2019   CL 111 02/13/2019   CO2 23 02/13/2019   BUN 7 (L) 02/13/2019   CREATININE 0.56 02/13/2019   PROT 5.8 (L) 02/12/2019   ALBUMIN 2.9 (L) 02/12/2019   BILITOT 0.6 02/12/2019   ALKPHOS 82 02/12/2019   AST 30 02/12/2019   ALT 13 02/12/2019  .   Total Discharge time is about 33 minutes  Shon Hale M.D on 02/13/2019 at 2:24 PM  Go to [email or web address] -  for contact info  [facility] - Office  [phone]

## 2019-02-13 NOTE — Discharge Instructions (Signed)
1)Please check your blood sugars at least 4 times a day until your glucose level more stable 2)Please eat snacks to avoid low blood sugars until your appetite improves enough 3)Your 70/30 insulin has been reduced to 20 units in the morning and 16 units in the evening for now due to your poor oral intake----you can titrate your insulin back up to 26 units in the morning and 16 units as your oral intake improves 4) follow-up to primary care physician within a week for recheck and reevaluation 5) continue to drink, maintain adequate hydration, avoid dehydration

## 2019-02-13 NOTE — Progress Notes (Signed)
Patient alert and oriented x4. No complaints of pain, shortness of breath, chest pain, dizziness, nausea or vomiting. Patient ate half a peanut butter and jelly sandwich and drank a carton of milk per Dr. Joesph Fillers. CBG up to 168. Education gone over with patient about eating snacks and some carb's especially when appetite is not good to help prevent hypoglycemia. Patient expressed understanding of education and provided teach back on understanding what to eat and when and what not to eat and when. Insulin education provided and patient provided teach back with full understanding. IV's removed without complications. Patient up out of bed with supervision, gait steady. Discharge summary, follow up appointments and medication education gone over with patient. Patient expressed full understanding of summary, follow up appointment information and education. Patient discharged home with all belongings via car. Patient's daughter was driving patient home.

## 2019-02-13 NOTE — Progress Notes (Signed)
Inpatient Diabetes Program Recommendations  AACE/ADA: New Consensus Statement on Inpatient Glycemic Control (2015)  Target Ranges:  Prepandial:   less than 140 mg/dL      Peak postprandial:   less than 180 mg/dL (1-2 hours)      Critically ill patients:  140 - 180 mg/dL  Results for CHARELL, FAULK (MRN [id number]) as of 02/13/2019 13:50  Ref. Range 02/13/2019 02:00 02/13/2019 02:44 02/13/2019 07:37 02/13/2019 11:48 02/13/2019 12:05 02/13/2019 12:21  Glucose-Capillary Latest Ref Range: 70 - 99 mg/dL 68 (L) 93 149 (H)  70/30 22 units   Novolog 3 units 47 (L) 61 (L) 87   Results for MAKINZE, JANI (MRN [id number]) as of 02/13/2019 13:50  Ref. Range 02/12/2019 16:57 02/12/2019 17:58 02/12/2019 18:12 02/12/2019 19:19  Glucose-Capillary Latest Ref Range: 70 - 99 mg/dL 174 (H)   70/20 22 units 175 (H) 131 (H)   Review of Glycemic Control  Diabetes history: DM2 Outpatient Diabetes medications: 70/30 26 units QAM, 70/30 16 units QPM, Metformin XR 1000 mg BID Current orders for Inpatient glycemic control: 70/30 22 units BID, Novolog 0-20 units TID with meals, Novolog 0-5 units QHS  Inpatient Diabetes Program Recommendations:   HgbA1C: Noted in Care Everywhere, A1C 11.6% on 09/19/18. Current A1C 13% on 02/12/19 indicating an average glucose of 326 mg/dl.  NOTE:  Patient's glucose noted to be 47 mg/dl today at 11:48 and hypoglycemia was treated and CBG up to 61 mg/dl. Anderson Malta, RN reports that patient is currently eating a peanut and butter sandwich to get glucose up prior to be discharged. Spoke with patient over the phone about diabetes and home regimen for diabetes control. Patient reports being followed by PCP for diabetes management and currently taking 70/30 26 units QAM, 70/30 16 units QPM, Metformin XR 1000 mg BID as an outpatient for diabetes control.  Patient has been getting 70/30 insulin (vials) from [facility] over the counter for $25 per vial.  Patient admits that she forgets and/or skips  insulin at times and she does not check glucose as she should. Patient states that she did not realize how dangerous DM can be and she has learned a lesson and plans to do much better with DM control. Patient reports that she has [employer] but her copays for insulins are expensive so she purchases affordable insulin over the counter at [employer]. Patient states she would prefer to use insulin pens.  Informed patient that [facility] has 70/30 insulin pens for $43 per box of 5 pens and insulin pen needles can also be purchased over the counter at [place] as well. Patient states she was not aware that [facility] had Novolin 70/30 in insulin pens and she will plan to purchase those going forward. Also encouraged patient to check into medication assistance programs through insulin manufactures to see if she would qualify for assistance with insulin cost.   Discussed A1C results (13% on 02/12/19 ) and explained that current A1C indicates an average glucose of 326 mg/dl over the past 2-3 months. Discussed glucose and A1C goals. Discussed importance of checking CBGs and maintaining good CBG control to prevent long-term and short-term complications. Explained how hyperglycemia leads to damage within blood vessels which lead to the common complications seen with uncontrolled diabetes. Stressed to the patient the importance of improving glycemic control to prevent further complications from uncontrolled diabetes. Patient admits that she resents having DM and the time it takes to keep it controlled. Provided emotional support and encouragement to do better with DM control  to decrease risk of further complications and to be healthy for herself and her family.  Encouraged patient to check glucose 3-4 times per day (before meals and at bedtime) and to keep a log book of glucose readings which patient will need to take to doctor appointments.  Patient verbalized understanding of information discussed and reports no further questions  at this time related to diabetes.  Thanks, Orlando PennerMarie Yahsir Wickens, RN, MSN, CDE Diabetes Coordinator Inpatient Diabetes Program [phone] (Team Pager)

## 2019-02-13 NOTE — Progress Notes (Signed)
Hypoglycemic Event  CBG: 47 Treatment: soda given per patient request over juice  Symptoms: None, patient alert and oriented with no complaints  Follow-up CBG: Time:1205 CBG Result: 61  Possible Reasons for Event: adjust 70/30 insulin and patient appetite at 50-75%  Comments/MD notified: Dr. Joesph Fillers  MD stated he will decrease the amount of 70/30 to be given  Gave and encouraged patient to drink more soda and will recheck in 15 minutes  Rechecked @1221  with result of 87. Dr Joesph Fillers made aware of all results and is adjusting 70/30 doses and states it is still ok to discharge after [place]

## 2019-02-13 NOTE — Clinical Social Work Note (Signed)
Met with patient per CM consult re: affordability of medications.  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  She states if she could afford it, she would get a pen.  Copay for that is around $400.00, even with insurance.  So she is resigned to using insulin that requires her to use syringe.  She states that costs her around $30.00 at [facility], which is affordable, as is the Metformin.  Gave her MATCH for insulin, for which she was very grateful.

## 2019-02-15 LAB — CULTURE, BLOOD (ROUTINE X 2)
Culture: NO GROWTH
Culture: NO GROWTH
Special Requests: ADEQUATE
Special Requests: ADEQUATE

## 2019-06-18 ENCOUNTER — Encounter (INDEPENDENT_AMBULATORY_CARE_PROVIDER_SITE_OTHER): Payer: BC Managed Care – PPO | Admitting: Ophthalmology

## 2019-06-18 DIAGNOSIS — E113391 Type 2 diabetes mellitus with moderate nonproliferative diabetic retinopathy without macular edema, right eye: Secondary | ICD-10-CM | POA: Diagnosis not present

## 2019-06-18 DIAGNOSIS — E11319 Type 2 diabetes mellitus with unspecified diabetic retinopathy without macular edema: Secondary | ICD-10-CM | POA: Diagnosis not present

## 2019-06-18 DIAGNOSIS — E113312 Type 2 diabetes mellitus with moderate nonproliferative diabetic retinopathy with macular edema, left eye: Secondary | ICD-10-CM | POA: Diagnosis not present

## 2019-06-18 DIAGNOSIS — H43813 Vitreous degeneration, bilateral: Secondary | ICD-10-CM

## 2019-08-21 ENCOUNTER — Other Ambulatory Visit: Payer: Self-pay | Admitting: Nurse Practitioner

## 2019-08-21 DIAGNOSIS — Z1231 Encounter for screening mammogram for malignant neoplasm of breast: Secondary | ICD-10-CM

## 2019-10-01 ENCOUNTER — Ambulatory Visit
Admission: RE | Admit: 2019-10-01 | Discharge: 2019-10-01 | Disposition: A | Discharge status: Home / Self Care | Payer: BC Managed Care – PPO | Source: Ambulatory Visit | Attending: Nurse Practitioner | Admitting: Nurse Practitioner

## 2019-10-01 ENCOUNTER — Other Ambulatory Visit: Payer: Self-pay

## 2019-10-01 DIAGNOSIS — Z1231 Encounter for screening mammogram for malignant neoplasm of breast: Secondary | ICD-10-CM

## 2019-10-29 ENCOUNTER — Encounter (INDEPENDENT_AMBULATORY_CARE_PROVIDER_SITE_OTHER): Payer: BC Managed Care – PPO | Admitting: Ophthalmology

## 2020-02-20 ENCOUNTER — Encounter (INDEPENDENT_AMBULATORY_CARE_PROVIDER_SITE_OTHER): Payer: BC Managed Care – PPO | Admitting: Ophthalmology

## 2020-03-03 ENCOUNTER — Encounter (INDEPENDENT_AMBULATORY_CARE_PROVIDER_SITE_OTHER): Payer: BC Managed Care – PPO | Admitting: Ophthalmology

## 2020-03-03 DIAGNOSIS — E11311 Type 2 diabetes mellitus with unspecified diabetic retinopathy with macular edema: Secondary | ICD-10-CM

## 2020-03-03 DIAGNOSIS — E113313 Type 2 diabetes mellitus with moderate nonproliferative diabetic retinopathy with macular edema, bilateral: Secondary | ICD-10-CM

## 2020-03-03 DIAGNOSIS — H43813 Vitreous degeneration, bilateral: Secondary | ICD-10-CM

## 2020-03-03 DIAGNOSIS — H2513 Age-related nuclear cataract, bilateral: Secondary | ICD-10-CM | POA: Diagnosis not present

## 2020-03-31 ENCOUNTER — Encounter (INDEPENDENT_AMBULATORY_CARE_PROVIDER_SITE_OTHER): Payer: BC Managed Care – PPO | Admitting: Ophthalmology

## 2020-04-04 ENCOUNTER — Other Ambulatory Visit: Payer: Self-pay

## 2020-04-04 ENCOUNTER — Encounter (INDEPENDENT_AMBULATORY_CARE_PROVIDER_SITE_OTHER): Payer: BC Managed Care – PPO | Admitting: Ophthalmology

## 2020-04-04 DIAGNOSIS — E11311 Type 2 diabetes mellitus with unspecified diabetic retinopathy with macular edema: Secondary | ICD-10-CM

## 2020-04-04 DIAGNOSIS — E113313 Type 2 diabetes mellitus with moderate nonproliferative diabetic retinopathy with macular edema, bilateral: Secondary | ICD-10-CM

## 2020-04-04 DIAGNOSIS — H43813 Vitreous degeneration, bilateral: Secondary | ICD-10-CM | POA: Diagnosis not present

## 2020-04-11 ENCOUNTER — Ambulatory Visit (INDEPENDENT_AMBULATORY_CARE_PROVIDER_SITE_OTHER): Payer: BC Managed Care – PPO | Admitting: Endocrinology

## 2020-04-11 ENCOUNTER — Encounter: Payer: Self-pay | Admitting: Endocrinology

## 2020-04-11 ENCOUNTER — Other Ambulatory Visit: Payer: Self-pay

## 2020-04-11 VITALS — BP 148/80 | HR 96 | Ht 60.0 in | Wt 144.6 lb

## 2020-04-11 DIAGNOSIS — E1110 Type 2 diabetes mellitus with ketoacidosis without coma: Secondary | ICD-10-CM

## 2020-04-11 LAB — POCT GLYCOSYLATED HEMOGLOBIN (HGB A1C): Hemoglobin A1C: 8.5 % — AB (ref 4.0–5.6)

## 2020-04-11 MED ORDER — ATORVASTATIN CALCIUM 10 MG PO TABS: 10.0000 mg | ORAL_TABLET | Freq: Every day | ORAL | 0 refills | Status: DC

## 2020-04-11 MED ORDER — LEVEMIR FLEXTOUCH 100 UNIT/ML ~~LOC~~ SOPN: 45.0000 [IU] | PEN_INJECTOR | SUBCUTANEOUS | 11 refills | Status: DC

## 2020-04-11 NOTE — Progress Notes (Signed)
Subjective:    Patient ID: Alicia Andrews, female    DOB: 10-11-1955, 64 y.o.   MRN: [id number]  HPI pt is referred by Dr Manson Passey, for diabetes.  Pt states DM was dx'ed in 1993; it is complicated by DR; she has been on insulin since 2020.  pt says her diet and exercise are poor; she has never had GDM (G0), pancreatitis, pancreatic surgery.  Last episode of severe hypoglycemia was 2020.  She had DKA once (2021).  Pt says she resents having DM.  Past Medical History:  Diagnosis Date  . Diabetes mellitus without complication (HCC)    Type 2     No past surgical history on file.  Social History   Socioeconomic History  . Marital status: Single    Spouse name: Not on file  . Number of children: Not on file  . Years of education: Not on file  . Highest education level: Not on file  Occupational History  . Not on file  Tobacco Use  . Smoking status: Never Smoker  . Smokeless tobacco: Never Used  Vaping Use  . Vaping Use: Never used  Substance and Sexual Activity  . Alcohol use: Not on file  . Drug use: Not on file  . Sexual activity: Not on file  Other Topics Concern  . Not on file  Social History Narrative  . Not on file   Social Determinants of Health   Financial Resource Strain:   . Difficulty of Paying Living Expenses: Not on file  Food Insecurity:   . Worried About Programme researcher, broadcasting/film/video in the Last Year: Not on file  . Ran Out of Food in the Last Year: Not on file  Transportation Needs:   . Lack of Transportation (Medical): Not on file  . Lack of Transportation (Non-Medical): Not on file  Physical Activity:   . Days of Exercise per Week: Not on file  . Minutes of Exercise per Session: Not on file  Stress:   . Feeling of Stress : Not on file  Social Connections:   . Frequency of Communication with Friends and Family: Not on file  . Frequency of Social Gatherings with Friends and Family: Not on file  . Attends Religious Services: Not on file  . Active Member of  Clubs or Organizations: Not on file  . Attends Banker Meetings: Not on file  . Marital Status: Not on file  Intimate Partner Violence:   . Fear of Current or Ex-Partner: Not on file  . Emotionally Abused: Not on file  . Physically Abused: Not on file  . Sexually Abused: Not on file    No current outpatient medications on file prior to visit.   No current facility-administered medications on file prior to visit.    No Known Allergies  Family History  Problem Relation Age of Onset  . Diabetes Father     BP (!) 148/80   Pulse 96   Ht 5' (1.524 m)   Wt 144 lb 9.6 oz (65.6 kg)   SpO2 99%   BMI 28.24 kg/m    Review of Systems denies weight loss, blurry vision, chest pain, sob, n/v, urinary frequency, depression.       Objective:   Physical Exam VITAL SIGNS:  See vs page.  GENERAL: no distress Pulses: dorsalis pedis intact bilat.   MSK: no deformity of the feet CV: no leg edema.  Skin:  no ulcer on the feet.  normal color and temp  on the feet.  Neuro: sensation is intact to touch on the feet.   Lab Results  Component Value Date   HGBA1C 8.5 (A) 04/11/2020   Lab Results  Component Value Date   CREATININE 0.56 02/13/2019   BUN 7 (L) 02/13/2019   NA 140 02/13/2019   K 3.4 (L) 02/13/2019   CL 111 02/13/2019   CO2 23 02/13/2019   I have reviewed outside records, and summarized: Pt was noted to have elevated A1c, and referred here.  She was admitted with DKA.  Pt reported this was due to noncompliance      Assessment & Plan:  Type 1 DM, with DR: uncontrolled Diabetes "burnout."  Goal A1c is in the 7's for now.  she wants to change to QD insulin.   HTN: is noted today  Patient Instructions  Your blood pressure is high today.  Please see your primary care provider soon, to have it rechecked good diet and exercise significantly improve the control of your diabetes.  please let me know if you wish to be referred to a dietician.  high blood sugar is  very risky to your health.  you should see an eye doctor and dentist every year.  It is very important to get all recommended vaccinations.  Controlling your blood pressure and cholesterol drastically reduces the damage diabetes does to your body.  Those who smoke should quit.  Please discuss these with your doctor.   check your blood sugar twice a day.  vary the time of day when you check, between before the 3 meals, and at bedtime.  also check if you have symptoms of your blood sugar being too high or too low.  please keep a record of the readings and bring it to your next appointment here (or you can bring the meter itself).  You can write it on any piece of paper.  please call us sooner if your blood sugar goes below 70, or if you have a lot of readings over 200.  I have sent a prescription to your pharmacy, to change the insulin to Levemir. On this type of insulin schedule, you should eat meals on a regular schedule.  If a meal is missed or significantly delayed, your blood sugar could go low.  This is probably not enough, so please call or message [place] next week, to tell us how the blood sugar is doing. We will need to take this complex situation in stages.  Please come back for a follow-up appointment in 6 weeks.

## 2020-04-11 NOTE — Patient Instructions (Addendum)
Your blood pressure is high today.  Please see your primary care provider soon, to have it rechecked good diet and exercise significantly improve the control of your diabetes.  please let me know if you wish to be referred to a dietician.  high blood sugar is very risky to your health.  you should see an eye doctor and dentist every year.  It is very important to get all recommended vaccinations.  Controlling your blood pressure and cholesterol drastically reduces the damage diabetes does to your body.  Those who smoke should quit.  Please discuss these with your doctor.   check your blood sugar twice a day.  vary the time of day when you check, between before the 3 meals, and at bedtime.  also check if you have symptoms of your blood sugar being too high or too low.  please keep a record of the readings and bring it to your next appointment here (or you can bring the meter itself).  You can write it on any piece of paper.  please call us sooner if your blood sugar goes below 70, or if you have a lot of readings over 200.  I have sent a prescription to your pharmacy, to change the insulin to Levemir. On this type of insulin schedule, you should eat meals on a regular schedule.  If a meal is missed or significantly delayed, your blood sugar could go low.  This is probably not enough, so please call or message [place] next week, to tell us how the blood sugar is doing. We will need to take this complex situation in stages.  Please come back for a follow-up appointment in 6 weeks.

## 2020-04-23 ENCOUNTER — Telehealth: Payer: Self-pay | Admitting: Endocrinology

## 2020-04-23 ENCOUNTER — Other Ambulatory Visit: Payer: Self-pay

## 2020-04-23 DIAGNOSIS — E1110 Type 2 diabetes mellitus with ketoacidosis without coma: Secondary | ICD-10-CM

## 2020-04-23 MED ORDER — LEVEMIR FLEXTOUCH 100 UNIT/ML ~~LOC~~ SOPN: 40.0000 [IU] | PEN_INJECTOR | SUBCUTANEOUS | 11 refills | Status: DC

## 2020-04-23 NOTE — Telephone Encounter (Signed)
Outpatient Medication Detail   Disp Refills Start End   insulin detemir (LEVEMIR FLEXTOUCH) 100 UNIT/ML FlexPen 15 mL 11 04/11/2020    Sig - Route: Inject 45 Units into the skin every morning. And pen needles 1/day - Subcutaneous   Sent to pharmacy as: insulin detemir (LEVEMIR FLEXTOUCH) 100 UNIT/ML FlexPen   E-Prescribing Status: Receipt confirmed by pharmacy (04/11/2020  2:27 PM EDT)    Please refer to CBG's below

## 2020-04-23 NOTE — Telephone Encounter (Signed)
Please reduce levemir to 40 units QAM

## 2020-04-23 NOTE — Telephone Encounter (Signed)
Called pt and informed about new orders. Verbalized acceptance and understanding. Medication list updated to reflect new orders.

## 2020-04-23 NOTE — Telephone Encounter (Signed)
Date    Time        BS Reading 08/30   9:10AM    85  7:50PM    59  09/04 7:02PM 286  11:00PM 186  09/05 5:18AM 52  11:00AM 87  09/06 1:30PM 101   09/07 7:30AM 64  11:40PM 254  09/08 7:30AM 52  11:25AM 125

## 2020-05-02 ENCOUNTER — Encounter (INDEPENDENT_AMBULATORY_CARE_PROVIDER_SITE_OTHER): Payer: BC Managed Care – PPO | Admitting: Ophthalmology

## 2020-05-02 ENCOUNTER — Other Ambulatory Visit: Payer: Self-pay

## 2020-05-02 DIAGNOSIS — E11311 Type 2 diabetes mellitus with unspecified diabetic retinopathy with macular edema: Secondary | ICD-10-CM

## 2020-05-02 DIAGNOSIS — E113313 Type 2 diabetes mellitus with moderate nonproliferative diabetic retinopathy with macular edema, bilateral: Secondary | ICD-10-CM

## 2020-05-02 DIAGNOSIS — H43813 Vitreous degeneration, bilateral: Secondary | ICD-10-CM | POA: Diagnosis not present

## 2020-05-20 ENCOUNTER — Ambulatory Visit (INDEPENDENT_AMBULATORY_CARE_PROVIDER_SITE_OTHER): Payer: BC Managed Care – PPO | Admitting: Endocrinology

## 2020-05-20 ENCOUNTER — Encounter: Payer: Self-pay | Admitting: Endocrinology

## 2020-05-20 ENCOUNTER — Other Ambulatory Visit: Payer: Self-pay

## 2020-05-20 DIAGNOSIS — E1110 Type 2 diabetes mellitus with ketoacidosis without coma: Secondary | ICD-10-CM | POA: Diagnosis not present

## 2020-05-20 LAB — POCT GLYCOSYLATED HEMOGLOBIN (HGB A1C): Hemoglobin A1C: 8 % — AB (ref 4.0–5.6)

## 2020-05-20 MED ORDER — LEVEMIR FLEXTOUCH 100 UNIT/ML ~~LOC~~ SOPN: 38.0000 [IU] | PEN_INJECTOR | SUBCUTANEOUS | 11 refills | Status: DC

## 2020-05-20 NOTE — Progress Notes (Signed)
Subjective:    Patient ID: Alicia Andrews, female    DOB: 1956-08-07, 64 y.o.   MRN: [id number]  HPI Pt returns for f/u of diabetes mellitus: DM type: 1 Dx'ed: 1993 Complications: DR Therapy: insulin since 2020 GDM: never (G0) DKA: once (2021) Severe hypoglycemia: Last episode of was 2020 Pancreatitis: never Pancreatic imaging: never SDOH: she takes QD insulin, due to diabetes "burnout."   Interval history: despite the reduction of levemir, she has fasting hypoglycemia approx once per week.  no cbg record, but states cbg's vary from 57-330.  It is in general higher as the day goes on.   Past Medical History:  Diagnosis Date  . Diabetes mellitus without complication (HCC)    Type 2     No past surgical history on file.  Social History   Socioeconomic History  . Marital status: Single    Spouse name: Not on file  . Number of children: Not on file  . Years of education: Not on file  . Highest education level: Not on file  Occupational History  . Not on file  Tobacco Use  . Smoking status: Never Smoker  . Smokeless tobacco: Never Used  Vaping Use  . Vaping Use: Never used  Substance and Sexual Activity  . Alcohol use: Not on file  . Drug use: Not on file  . Sexual activity: Not on file  Other Topics Concern  . Not on file  Social History Narrative  . Not on file   Social Determinants of Health   Financial Resource Strain:   . Difficulty of Paying Living Expenses: Not on file  Food Insecurity:   . Worried About Programme researcher, broadcasting/film/video in the Last Year: Not on file  . Ran Out of Food in the Last Year: Not on file  Transportation Needs:   . Lack of Transportation (Medical): Not on file  . Lack of Transportation (Non-Medical): Not on file  Physical Activity:   . Days of Exercise per Week: Not on file  . Minutes of Exercise per Session: Not on file  Stress:   . Feeling of Stress : Not on file  Social Connections:   . Frequency of Communication with Friends  and Family: Not on file  . Frequency of Social Gatherings with Friends and Family: Not on file  . Attends Religious Services: Not on file  . Active Member of Clubs or Organizations: Not on file  . Attends Banker Meetings: Not on file  . Marital Status: Not on file  Intimate Partner Violence:   . Fear of Current or Ex-Partner: Not on file  . Emotionally Abused: Not on file  . Physically Abused: Not on file  . Sexually Abused: Not on file    Current Outpatient Medications on File Prior to Visit  Medication Sig Dispense Refill  . atorvastatin (LIPITOR) 10 MG tablet Take 1 tablet (10 mg total) by mouth daily. 90 tablet 0   No current facility-administered medications on file prior to visit.    No Known Allergies  Family History  Problem Relation Age of Onset  . Diabetes Father     BP (!) 142/78   Pulse 94   Ht 5' (1.524 m)   Wt 147 lb 6.4 oz (66.9 kg)   SpO2 98%   BMI 28.79 kg/m    Review of Systems     Objective:   Physical Exam VITAL SIGNS:  See vs page GENERAL: no distress Pulses: dorsalis pedis intact bilat.  MSK: no deformity of the feet CV: no leg edema Skin:  no ulcer on the feet.  normal color and temp on the feet. Neuro: sensation is intact to touch on the feet  Lab Results  Component Value Date   HGBA1C 8.0 (A) 05/20/2020        Assessment & Plan:  HTN: is noted today Type 1 DM, with DR Hypoglycemia, due to insulin: this limits aggressiveness of glycemic control  Patient Instructions  Your blood pressure is high today.  Please see your primary care provider soon, to have it rechecked check your blood sugar twice a day.  vary the time of day when you check, between before the 3 meals, and at bedtime.  also check if you have symptoms of your blood sugar being too high or too low.  please keep a record of the readings and bring it to your next appointment here (or you can bring the meter itself).  You can write it on any piece of paper.   please call us sooner if your blood sugar goes below 70, or if you have a lot of readings over 200.  Please reduce the Levemir to 38 units each morning.  On this type of insulin schedule, you should eat meals on a regular schedule.  If a meal is missed or significantly delayed, your blood sugar could go low.   Please come back for a follow-up appointment in 2 months.

## 2020-05-20 NOTE — Patient Instructions (Addendum)
Your blood pressure is high today.  Please see your primary care provider soon, to have it rechecked check your blood sugar twice a day.  vary the time of day when you check, between before the 3 meals, and at bedtime.  also check if you have symptoms of your blood sugar being too high or too low.  please keep a record of the readings and bring it to your next appointment here (or you can bring the meter itself).  You can write it on any piece of paper.  please call us sooner if your blood sugar goes below 70, or if you have a lot of readings over 200.  Please reduce the Levemir to 38 units each morning.  On this type of insulin schedule, you should eat meals on a regular schedule.  If a meal is missed or significantly delayed, your blood sugar could go low.   Please come back for a follow-up appointment in 2 months.

## 2020-05-27 ENCOUNTER — Ambulatory Visit: Payer: BC Managed Care – PPO | Admitting: Endocrinology

## 2020-06-02 ENCOUNTER — Encounter (INDEPENDENT_AMBULATORY_CARE_PROVIDER_SITE_OTHER): Payer: BC Managed Care – PPO | Admitting: Ophthalmology

## 2020-06-02 ENCOUNTER — Other Ambulatory Visit: Payer: Self-pay

## 2020-06-02 DIAGNOSIS — E113313 Type 2 diabetes mellitus with moderate nonproliferative diabetic retinopathy with macular edema, bilateral: Secondary | ICD-10-CM | POA: Diagnosis not present

## 2020-06-02 DIAGNOSIS — H43813 Vitreous degeneration, bilateral: Secondary | ICD-10-CM | POA: Diagnosis not present

## 2020-06-02 DIAGNOSIS — E11311 Type 2 diabetes mellitus with unspecified diabetic retinopathy with macular edema: Secondary | ICD-10-CM | POA: Diagnosis not present

## 2020-06-30 ENCOUNTER — Encounter (INDEPENDENT_AMBULATORY_CARE_PROVIDER_SITE_OTHER): Payer: BC Managed Care – PPO | Admitting: Ophthalmology

## 2020-06-30 ENCOUNTER — Other Ambulatory Visit: Payer: Self-pay

## 2020-06-30 DIAGNOSIS — E11311 Type 2 diabetes mellitus with unspecified diabetic retinopathy with macular edema: Secondary | ICD-10-CM

## 2020-06-30 DIAGNOSIS — H43813 Vitreous degeneration, bilateral: Secondary | ICD-10-CM | POA: Diagnosis not present

## 2020-06-30 DIAGNOSIS — E113313 Type 2 diabetes mellitus with moderate nonproliferative diabetic retinopathy with macular edema, bilateral: Secondary | ICD-10-CM | POA: Diagnosis not present

## 2020-07-21 ENCOUNTER — Encounter: Payer: Self-pay | Admitting: Endocrinology

## 2020-07-21 ENCOUNTER — Ambulatory Visit (INDEPENDENT_AMBULATORY_CARE_PROVIDER_SITE_OTHER): Payer: BC Managed Care – PPO | Admitting: Endocrinology

## 2020-07-21 ENCOUNTER — Other Ambulatory Visit: Payer: Self-pay

## 2020-07-21 VITALS — BP 122/64 | HR 98 | Ht 61.0 in | Wt 148.0 lb

## 2020-07-21 DIAGNOSIS — E1110 Type 2 diabetes mellitus with ketoacidosis without coma: Secondary | ICD-10-CM | POA: Diagnosis not present

## 2020-07-21 LAB — POCT GLYCOSYLATED HEMOGLOBIN (HGB A1C): Hemoglobin A1C: 8.2 % — AB (ref 4.0–5.6)

## 2020-07-21 MED ORDER — ACARBOSE 25 MG PO TABS: 25.0000 mg | ORAL_TABLET | Freq: Three times a day (TID) | ORAL | 11 refills | Status: DC

## 2020-07-21 MED ORDER — INSULIN ISOPHANE HUMAN 100 UNIT/ML KWIKPEN: 26.0000 [IU] | PEN_INJECTOR | SUBCUTANEOUS | 11 refills | Status: DC

## 2020-07-21 NOTE — Progress Notes (Signed)
Subjective:    Patient ID: Alicia Andrews, female    DOB: 09/13/1955, 64 y.o.   MRN: [id number]  HPI Pt returns for f/u of diabetes mellitus: DM type: 1 Dx'ed: 1993 Complications: DR Therapy: insulin since 2020 GDM: never (G0) DKA: once (2021) Severe hypoglycemia: Last episode of was 2020 Pancreatitis: never Pancreatic imaging: never SDOH: she takes QD insulin, due to diabetes "burnout."   Interval history: despite the reduction of levemir, she has fasting hypoglycemia approx once per month.  no cbg record, but states cbg's vary from 61-228.  It is in general higher as the day goes on.  Past Medical History:  Diagnosis Date  . Diabetes mellitus without complication (HCC)    Type 2     No past surgical history on file.  Social History   Socioeconomic History  . Marital status: Single    Spouse name: Not on file  . Number of children: Not on file  . Years of education: Not on file  . Highest education level: Not on file  Occupational History  . Not on file  Tobacco Use  . Smoking status: Never Smoker  . Smokeless tobacco: Never Used  Vaping Use  . Vaping Use: Never used  Substance and Sexual Activity  . Alcohol use: Not on file  . Drug use: Not on file  . Sexual activity: Not on file  Other Topics Concern  . Not on file  Social History Narrative  . Not on file   Social Determinants of Health   Financial Resource Strain:   . Difficulty of Paying Living Expenses: Not on file  Food Insecurity:   . Worried About Programme researcher, broadcasting/film/video in the Last Year: Not on file  . Ran Out of Food in the Last Year: Not on file  Transportation Needs:   . Lack of Transportation (Medical): Not on file  . Lack of Transportation (Non-Medical): Not on file  Physical Activity:   . Days of Exercise per Week: Not on file  . Minutes of Exercise per Session: Not on file  Stress:   . Feeling of Stress : Not on file  Social Connections:   . Frequency of Communication with Friends  and Family: Not on file  . Frequency of Social Gatherings with Friends and Family: Not on file  . Attends Religious Services: Not on file  . Active Member of Clubs or Organizations: Not on file  . Attends Banker Meetings: Not on file  . Marital Status: Not on file  Intimate Partner Violence:   . Fear of Current or Ex-Partner: Not on file  . Emotionally Abused: Not on file  . Physically Abused: Not on file  . Sexually Abused: Not on file    Current Outpatient Medications on File Prior to Visit  Medication Sig Dispense Refill  . atorvastatin (LIPITOR) 10 MG tablet Take 1 tablet (10 mg total) by mouth daily. 90 tablet 0   No current facility-administered medications on file prior to visit.    No Known Allergies  Family History  Problem Relation Age of Onset  . Diabetes Father     BP 122/64   Pulse 98   Ht 5\' 1"  (1.549 m)   Wt 148 lb (67.1 kg)   SpO2 97%   BMI 27.96 kg/m    Review of Systems Denies LOC.  She reports constipation    Objective:   Physical Exam VITAL SIGNS:  See vs page GENERAL: no distress Pulses: dorsalis pedis  intact bilat.   MSK: no deformity of the feet CV: no leg edema Skin:  no ulcer on the feet.  normal color and temp on the feet. Neuro: sensation is intact to touch on the feet  A1c=8.2%      Assessment & Plan:  Insulin-requiring type 2 DM, with DR: uncontrolled Hypoglycemia, due to insulin: we'll change to a faster-acting qam insulin  Patient Instructions  check your blood sugar twice a day.  vary the time of day when you check, between before the 3 meals, and at bedtime.  also check if you have symptoms of your blood sugar being too high or too low.  please keep a record of the readings and bring it to your next appointment here (or you can bring the meter itself).  You can write it on any piece of paper.  please call us sooner if your blood sugar goes below 70, or if you have a lot of readings over 200.  Please change the  Levemir to NPH insulin, 26 units each morning.  On this type of insulin schedule, you should eat meals on a regular schedule (especially lunch).  If a meal is missed or significantly delayed, your blood sugar could go low.  Please call or message [place] next week, to tell us how the blood sugar is doing.  I have also sent a prescription to your pharmacy, to add "acarbose." Please come back for a follow-up appointment in 2 months.

## 2020-07-21 NOTE — Patient Instructions (Addendum)
check your blood sugar twice a day.  vary the time of day when you check, between before the 3 meals, and at bedtime.  also check if you have symptoms of your blood sugar being too high or too low.  please keep a record of the readings and bring it to your next appointment here (or you can bring the meter itself).  You can write it on any piece of paper.  please call us sooner if your blood sugar goes below 70, or if you have a lot of readings over 200.  Please change the Levemir to NPH insulin, 26 units each morning.  On this type of insulin schedule, you should eat meals on a regular schedule (especially lunch).  If a meal is missed or significantly delayed, your blood sugar could go low.  Please call or message [place] next week, to tell us how the blood sugar is doing.  I have also sent a prescription to your pharmacy, to add "acarbose." Please come back for a follow-up appointment in 2 months.

## 2020-07-23 ENCOUNTER — Telehealth: Payer: Self-pay | Admitting: *Deleted

## 2020-07-23 MED ORDER — INSULIN NPH (HUMAN) (ISOPHANE) 100 UNIT/ML ~~LOC~~ SUSP: 26.0000 [IU] | SUBCUTANEOUS | 11 refills | Status: DC

## 2020-07-23 NOTE — Addendum Note (Signed)
Addended by: Romero Belling on: 07/23/2020 03:17 PM   Modules accepted: Orders

## 2020-07-23 NOTE — Telephone Encounter (Signed)
If you don't mind drawing from the vial, the price drops to $25.  Ok to change?

## 2020-07-23 NOTE — Telephone Encounter (Signed)
Patient called stating she seen dr Everardo All yesterday and he changed her insulin to humaln isophane and it will be 540$ for a 30day prescription. Please advise    Pharmacy [facility] [address] in [place].   [phone] ok to leave to a detailed message

## 2020-07-23 NOTE — Telephone Encounter (Signed)
Please advise 

## 2020-07-23 NOTE — Telephone Encounter (Signed)
Noted. Thanks.

## 2020-07-23 NOTE — Telephone Encounter (Signed)
Called patient, she is okay to change to vial .

## 2020-07-23 NOTE — Telephone Encounter (Signed)
Ok, I have sent a prescription to [facility]

## 2020-07-28 ENCOUNTER — Other Ambulatory Visit: Payer: Self-pay

## 2020-07-28 ENCOUNTER — Encounter (INDEPENDENT_AMBULATORY_CARE_PROVIDER_SITE_OTHER): Payer: BC Managed Care – PPO | Admitting: Ophthalmology

## 2020-07-28 DIAGNOSIS — H43813 Vitreous degeneration, bilateral: Secondary | ICD-10-CM | POA: Diagnosis not present

## 2020-07-28 DIAGNOSIS — E11311 Type 2 diabetes mellitus with unspecified diabetic retinopathy with macular edema: Secondary | ICD-10-CM

## 2020-07-28 DIAGNOSIS — E113313 Type 2 diabetes mellitus with moderate nonproliferative diabetic retinopathy with macular edema, bilateral: Secondary | ICD-10-CM

## 2020-08-16 HISTORY — PX: DIAGNOSTIC MAMMOGRAM: HXRAD719

## 2020-08-25 ENCOUNTER — Encounter (INDEPENDENT_AMBULATORY_CARE_PROVIDER_SITE_OTHER): Payer: BC Managed Care – PPO | Admitting: Ophthalmology

## 2020-08-25 ENCOUNTER — Other Ambulatory Visit: Payer: Self-pay

## 2020-08-25 DIAGNOSIS — H43813 Vitreous degeneration, bilateral: Secondary | ICD-10-CM | POA: Diagnosis not present

## 2020-08-25 DIAGNOSIS — E113312 Type 2 diabetes mellitus with moderate nonproliferative diabetic retinopathy with macular edema, left eye: Secondary | ICD-10-CM

## 2020-08-25 DIAGNOSIS — E113391 Type 2 diabetes mellitus with moderate nonproliferative diabetic retinopathy without macular edema, right eye: Secondary | ICD-10-CM

## 2020-09-22 ENCOUNTER — Other Ambulatory Visit: Payer: Self-pay

## 2020-09-22 ENCOUNTER — Encounter (INDEPENDENT_AMBULATORY_CARE_PROVIDER_SITE_OTHER): Payer: BC Managed Care – PPO | Admitting: Ophthalmology

## 2020-09-22 ENCOUNTER — Ambulatory Visit (INDEPENDENT_AMBULATORY_CARE_PROVIDER_SITE_OTHER): Payer: BC Managed Care – PPO | Admitting: Endocrinology

## 2020-09-22 VITALS — BP 152/90 | HR 94 | Ht 60.0 in | Wt 151.8 lb

## 2020-09-22 DIAGNOSIS — E113391 Type 2 diabetes mellitus with moderate nonproliferative diabetic retinopathy without macular edema, right eye: Secondary | ICD-10-CM

## 2020-09-22 DIAGNOSIS — E113312 Type 2 diabetes mellitus with moderate nonproliferative diabetic retinopathy with macular edema, left eye: Secondary | ICD-10-CM

## 2020-09-22 DIAGNOSIS — H43813 Vitreous degeneration, bilateral: Secondary | ICD-10-CM | POA: Diagnosis not present

## 2020-09-22 DIAGNOSIS — E1110 Type 2 diabetes mellitus with ketoacidosis without coma: Secondary | ICD-10-CM

## 2020-09-22 LAB — POCT GLYCOSYLATED HEMOGLOBIN (HGB A1C): Hemoglobin A1C: 8.4 % — AB (ref 4.0–5.6)

## 2020-09-22 MED ORDER — NOVOLIN 70/30 RELION (70-30) 100 UNIT/ML ~~LOC~~ SUSP: 28.0000 [IU] | Freq: Every day | SUBCUTANEOUS | 11 refills | Status: DC

## 2020-09-22 NOTE — Patient Instructions (Addendum)
check your blood sugar twice a day.  vary the time of day when you check, between before the 3 meals, and at bedtime.  also check if you have symptoms of your blood sugar being too high or too low.  please keep a record of the readings and bring it to your next appointment here (or you can bring the meter itself).  You can write it on any piece of paper.  please call us sooner if your blood sugar goes below 70, or if you have a lot of readings over 200.  Please change the NPH insulin to "70/30," 26 units each morning.  On this type of insulin schedule, you should eat meals on a regular schedule (especially lunch).  If a meal is missed or significantly delayed, your blood sugar could go low.  Please come back for a follow-up appointment in 2 months.

## 2020-09-22 NOTE — Progress Notes (Signed)
Subjective:    Patient ID: Alicia Andrews, female    DOB: 1955-10-29, 65 y.o.   MRN: [id number]  HPI Pt returns for f/u of diabetes mellitus:  DM type: 1 Dx'ed: 1993 Complications: DR Therapy: insulin since 2020 GDM: never (G0) DKA: once (2021) Severe hypoglycemia: Last episode of was 2020 Pancreatitis: never Pancreatic imaging: never SDOH: she takes QD insulin, due to diabetes "burnout."   Interval history: no cbg record, but states cbg's vary from 30-170.  It is in general lowest in the middle of the night, and highest in the afternoon.  She rarely misses the insulin.  Past Medical History:  Diagnosis Date  . Diabetes mellitus without complication (HCC)    Type 2     No past surgical history on file.  Social History   Socioeconomic History  . Marital status: Single    Spouse name: Not on file  . Number of children: Not on file  . Years of education: Not on file  . Highest education level: Not on file  Occupational History  . Not on file  Tobacco Use  . Smoking status: Never Smoker  . Smokeless tobacco: Never Used  Vaping Use  . Vaping Use: Never used  Substance and Sexual Activity  . Alcohol use: Not on file  . Drug use: Not on file  . Sexual activity: Not on file  Other Topics Concern  . Not on file  Social History Narrative  . Not on file   Social Determinants of Health   Financial Resource Strain: Not on file  Food Insecurity: Not on file  Transportation Needs: Not on file  Physical Activity: Not on file  Stress: Not on file  Social Connections: Not on file  Intimate Partner Violence: Not on file    Current Outpatient Medications on File Prior to Visit  Medication Sig Dispense Refill  . acarbose (PRECOSE) 25 MG tablet Take 1 tablet (25 mg total) by mouth 3 (three) times daily with meals. 90 tablet 11  . atorvastatin (LIPITOR) 10 MG tablet Take 1 tablet (10 mg total) by mouth daily. 90 tablet 0   No current facility-administered medications  on file prior to visit.    No Known Allergies  Family History  Problem Relation Age of Onset  . Diabetes Father     BP (!) 152/90 (BP Location: Right Arm, Patient Position: Sitting, Cuff Size: Normal)   Pulse 94   Ht 5' (1.524 m)   Wt 151 lb 12.8 oz (68.9 kg)   SpO2 98%   BMI 29.65 kg/m    Review of Systems Denies LOC    Objective:   Physical Exam VITAL SIGNS:  See vs page GENERAL: no distress Pulses: dorsalis pedis intact bilat.   MSK: no deformity of the feet CV: no leg edema Skin:  no ulcer on the feet.  normal color and temp on the feet. Neuro: sensation is intact to touch on the feet  Lab Results  Component Value Date   HGBA1C 8.4 (A) 09/22/2020   Lab Results  Component Value Date   CREATININE 0.56 02/13/2019   BUN 7 (L) 02/13/2019   NA 140 02/13/2019   K 3.4 (L) 02/13/2019   CL 111 02/13/2019   CO2 23 02/13/2019        Assessment & Plan:  Type 1 DM, with DR: uncontrolled.  Hypoglycemia, due to insulin: we'll change to a faster time-release insulin.    Patient Instructions  check your blood sugar twice a  day.  vary the time of day when you check, between before the 3 meals, and at bedtime.  also check if you have symptoms of your blood sugar being too high or too low.  please keep a record of the readings and bring it to your next appointment here (or you can bring the meter itself).  You can write it on any piece of paper.  please call us sooner if your blood sugar goes below 70, or if you have a lot of readings over 200.  Please change the NPH insulin to "70/30," 26 units each morning.  On this type of insulin schedule, you should eat meals on a regular schedule (especially lunch).  If a meal is missed or significantly delayed, your blood sugar could go low.  Please come back for a follow-up appointment in 2 months.

## 2020-09-29 ENCOUNTER — Telehealth: Payer: Self-pay | Admitting: Endocrinology

## 2020-09-29 NOTE — Telephone Encounter (Signed)
Patient requests to be called at ph# [phone] re: Patient has been having high blood sugars-running in the 200's. Today blood sugars are 215. Patient states she has been increasing her insulin dosage to 30 units, however, blood sugars are still remaining high.

## 2020-09-29 NOTE — Telephone Encounter (Signed)
Spoke with pt and she stated that her BS has been running high. She was originally on 28 units and she has bumped it up to 30 units but it is still running high. Wanted to know if you wanted to adjust her meds. I told her to let us know what it is tomorrow as well with the 30 units.  Please Advsie

## 2020-09-30 NOTE — Telephone Encounter (Signed)
Please increase to 32 units.  We have to go slowly, due to h/o severe hypoglycemia.

## 2020-09-30 NOTE — Telephone Encounter (Signed)
Notified pt insulin instruction by Dr. Everardo All

## 2020-10-21 ENCOUNTER — Other Ambulatory Visit: Payer: Self-pay

## 2020-10-21 ENCOUNTER — Encounter (INDEPENDENT_AMBULATORY_CARE_PROVIDER_SITE_OTHER): Payer: BC Managed Care – PPO | Admitting: Ophthalmology

## 2020-10-21 DIAGNOSIS — E113313 Type 2 diabetes mellitus with moderate nonproliferative diabetic retinopathy with macular edema, bilateral: Secondary | ICD-10-CM

## 2020-10-21 DIAGNOSIS — H43813 Vitreous degeneration, bilateral: Secondary | ICD-10-CM

## 2020-11-04 ENCOUNTER — Other Ambulatory Visit: Payer: Self-pay | Admitting: Nurse Practitioner

## 2020-11-04 DIAGNOSIS — Z1231 Encounter for screening mammogram for malignant neoplasm of breast: Secondary | ICD-10-CM

## 2020-11-18 ENCOUNTER — Other Ambulatory Visit: Payer: Self-pay

## 2020-11-18 ENCOUNTER — Encounter (INDEPENDENT_AMBULATORY_CARE_PROVIDER_SITE_OTHER): Payer: BC Managed Care – PPO | Admitting: Ophthalmology

## 2020-11-18 DIAGNOSIS — H43813 Vitreous degeneration, bilateral: Secondary | ICD-10-CM | POA: Diagnosis not present

## 2020-11-18 DIAGNOSIS — E113313 Type 2 diabetes mellitus with moderate nonproliferative diabetic retinopathy with macular edema, bilateral: Secondary | ICD-10-CM

## 2020-11-18 DIAGNOSIS — H2513 Age-related nuclear cataract, bilateral: Secondary | ICD-10-CM

## 2020-11-20 ENCOUNTER — Ambulatory Visit (INDEPENDENT_AMBULATORY_CARE_PROVIDER_SITE_OTHER): Payer: BC Managed Care – PPO | Admitting: Endocrinology

## 2020-11-20 ENCOUNTER — Other Ambulatory Visit: Payer: Self-pay

## 2020-11-20 VITALS — BP 136/78 | HR 97 | Ht 61.0 in | Wt 151.0 lb

## 2020-11-20 DIAGNOSIS — E1110 Type 2 diabetes mellitus with ketoacidosis without coma: Secondary | ICD-10-CM

## 2020-11-20 LAB — POCT GLYCOSYLATED HEMOGLOBIN (HGB A1C): Hemoglobin A1C: 8.2 % — AB (ref 4.0–5.6)

## 2020-11-20 MED ORDER — NOVOLIN N FLEXPEN RELION 100 UNIT/ML ~~LOC~~ SUPN: 18.0000 [IU] | PEN_INJECTOR | Freq: Every day | SUBCUTANEOUS | 11 refills | Status: DC

## 2020-11-20 MED ORDER — NOVOLIN R FLEXPEN RELION 100 UNIT/ML IJ SOPN: 14.0000 [IU] | PEN_INJECTOR | Freq: Every day | INTRAMUSCULAR | 3 refills | Status: DC

## 2020-11-20 NOTE — Progress Notes (Signed)
Subjective:    Patient ID: Alicia Andrews, female    DOB: 14-Feb-1956, 65 y.o.   MRN: [id number]  HPI Pt returns for f/u of diabetes mellitus:  DM type: 1 Dx'ed: 1993 Complications: DR Therapy: insulin since 2020 GDM: never (G0) DKA: once (2021) Severe hypoglycemia: Last episode of was 2020 Pancreatitis: never Pancreatic imaging: never SDOH: she takes QD insulin, due to diabetes "burnout."  She takes human insulin, due to cost.   Interval history: no cbg record, but states cbg's vary from 55-221.  It is still in general lowest in the middle of the night, and highest in the afternoon.  She rarely misses the insulin. She takes 32 units qam.   Past Medical History:  Diagnosis Date  . Diabetes mellitus without complication (HCC)    Type 2     No past surgical history on file.  Social History   Socioeconomic History  . Marital status: Single    Spouse name: Not on file  . Number of children: Not on file  . Years of education: Not on file  . Highest education level: Not on file  Occupational History  . Not on file  Tobacco Use  . Smoking status: Never Smoker  . Smokeless tobacco: Never Used  Vaping Use  . Vaping Use: Never used  Substance and Sexual Activity  . Alcohol use: Not on file  . Drug use: Not on file  . Sexual activity: Not on file  Other Topics Concern  . Not on file  Social History Narrative  . Not on file   Social Determinants of Health   Financial Resource Strain: Not on file  Food Insecurity: Not on file  Transportation Needs: Not on file  Physical Activity: Not on file  Stress: Not on file  Social Connections: Not on file  Intimate Partner Violence: Not on file    Current Outpatient Medications on File Prior to Visit  Medication Sig Dispense Refill  . acarbose (PRECOSE) 25 MG tablet Take 1 tablet (25 mg total) by mouth 3 (three) times daily with meals. 90 tablet 11  . atorvastatin (LIPITOR) 10 MG tablet Take 1 tablet (10 mg total) by  mouth daily. 90 tablet 0   No current facility-administered medications on file prior to visit.    No Known Allergies  Family History  Problem Relation Age of Onset  . Diabetes Father     BP 136/78 (BP Location: Right Arm, Patient Position: Sitting, Cuff Size: Normal)   Pulse 97   Ht 5\' 1"  (1.549 m)   Wt 151 lb (68.5 kg)   SpO2 98%   BMI 28.53 kg/m    Review of Systems     Objective:   Physical Exam    Lab Results  Component Value Date   HGBA1C 8.2 (A) 11/20/2020       Assessment & Plan:  Type 1 DM Hypoglycemia, due to insulin: The pattern of his cbg's indicates she needs some adjustment in his therapy.    Patient Instructions  check your blood sugar twice a day.  vary the time of day when you check, between before the 3 meals, and at bedtime.  also check if you have symptoms of your blood sugar being too high or too low.  please keep a record of the readings and bring it to your next appointment here (or you can bring the meter itself).  You can write it on any piece of paper.  please call 01/20/2021 sooner if your  blood sugar goes below 70, or if you have a lot of readings over 200.  Please change the 70/30 to 14 units of R and 18 of N, both with breakfast.  On this type of insulin schedule, you should eat meals on a regular schedule (especially lunch).  If a meal is missed or significantly delayed, your blood sugar could go low.  Please come back for a follow-up appointment in 2 months.

## 2020-11-20 NOTE — Patient Instructions (Addendum)
check your blood sugar twice a day.  vary the time of day when you check, between before the 3 meals, and at bedtime.  also check if you have symptoms of your blood sugar being too high or too low.  please keep a record of the readings and bring it to your next appointment here (or you can bring the meter itself).  You can write it on any piece of paper.  please call us sooner if your blood sugar goes below 70, or if you have a lot of readings over 200.  Please change the 70/30 to 14 units of R and 18 of N, both with breakfast.  On this type of insulin schedule, you should eat meals on a regular schedule (especially lunch).  If a meal is missed or significantly delayed, your blood sugar could go low.  Please come back for a follow-up appointment in 2 months.

## 2020-12-15 ENCOUNTER — Encounter (INDEPENDENT_AMBULATORY_CARE_PROVIDER_SITE_OTHER): Payer: BC Managed Care – PPO | Admitting: Ophthalmology

## 2020-12-15 ENCOUNTER — Other Ambulatory Visit: Payer: Self-pay

## 2020-12-15 DIAGNOSIS — E113313 Type 2 diabetes mellitus with moderate nonproliferative diabetic retinopathy with macular edema, bilateral: Secondary | ICD-10-CM | POA: Diagnosis not present

## 2020-12-15 DIAGNOSIS — H43813 Vitreous degeneration, bilateral: Secondary | ICD-10-CM | POA: Diagnosis not present

## 2020-12-31 ENCOUNTER — Ambulatory Visit
Admission: RE | Admit: 2020-12-31 | Discharge: 2020-12-31 | Disposition: A | Discharge status: Home / Self Care | Payer: BC Managed Care – PPO | Source: Ambulatory Visit | Attending: Nurse Practitioner | Admitting: Nurse Practitioner

## 2020-12-31 ENCOUNTER — Other Ambulatory Visit: Payer: Self-pay

## 2020-12-31 ENCOUNTER — Other Ambulatory Visit: Payer: Self-pay | Admitting: Nurse Practitioner

## 2020-12-31 DIAGNOSIS — Z1231 Encounter for screening mammogram for malignant neoplasm of breast: Secondary | ICD-10-CM

## 2020-12-31 DIAGNOSIS — R928 Other abnormal and inconclusive findings on diagnostic imaging of breast: Secondary | ICD-10-CM

## 2021-01-19 ENCOUNTER — Encounter (INDEPENDENT_AMBULATORY_CARE_PROVIDER_SITE_OTHER): Payer: BC Managed Care – PPO | Admitting: Ophthalmology

## 2021-01-19 ENCOUNTER — Other Ambulatory Visit: Payer: Self-pay

## 2021-01-19 DIAGNOSIS — E113313 Type 2 diabetes mellitus with moderate nonproliferative diabetic retinopathy with macular edema, bilateral: Secondary | ICD-10-CM | POA: Diagnosis not present

## 2021-01-19 DIAGNOSIS — H43813 Vitreous degeneration, bilateral: Secondary | ICD-10-CM

## 2021-01-22 ENCOUNTER — Ambulatory Visit: Admission: RE | Admit: 2021-01-22 | Payer: BC Managed Care – PPO | Source: Ambulatory Visit

## 2021-01-22 ENCOUNTER — Ambulatory Visit
Admission: RE | Admit: 2021-01-22 | Discharge: 2021-01-22 | Disposition: A | Discharge status: Home / Self Care | Payer: BC Managed Care – PPO | Source: Ambulatory Visit | Attending: Nurse Practitioner | Admitting: Nurse Practitioner

## 2021-01-22 ENCOUNTER — Other Ambulatory Visit: Payer: Self-pay

## 2021-01-22 DIAGNOSIS — R928 Other abnormal and inconclusive findings on diagnostic imaging of breast: Secondary | ICD-10-CM

## 2021-02-04 ENCOUNTER — Ambulatory Visit (INDEPENDENT_AMBULATORY_CARE_PROVIDER_SITE_OTHER): Payer: BC Managed Care – PPO | Admitting: Endocrinology

## 2021-02-04 ENCOUNTER — Other Ambulatory Visit: Payer: Self-pay

## 2021-02-04 VITALS — BP 144/72 | HR 88 | Ht 61.0 in | Wt 149.5 lb

## 2021-02-04 DIAGNOSIS — E1110 Type 2 diabetes mellitus with ketoacidosis without coma: Secondary | ICD-10-CM | POA: Diagnosis not present

## 2021-02-04 LAB — POCT GLYCOSYLATED HEMOGLOBIN (HGB A1C): Hemoglobin A1C: 7.9 % — AB (ref 4.0–5.6)

## 2021-02-04 MED ORDER — NOVOLIN R FLEXPEN RELION 100 UNIT/ML IJ SOPN: 16.0000 [IU] | PEN_INJECTOR | Freq: Every day | INTRAMUSCULAR | 3 refills | Status: DC

## 2021-02-04 MED ORDER — NOVOLIN N FLEXPEN RELION 100 UNIT/ML ~~LOC~~ SUPN: 16.0000 [IU] | PEN_INJECTOR | Freq: Every day | SUBCUTANEOUS | 11 refills | Status: DC

## 2021-02-04 NOTE — Patient Instructions (Addendum)
check your blood sugar twice a day.  vary the time of day when you check, between before the 3 meals, and at bedtime.  also check if you have symptoms of your blood sugar being too high or too low.  please keep a record of the readings and bring it to your next appointment here (or you can bring the meter itself).  You can write it on any piece of paper.  please call us sooner if your blood sugar goes below 70, or if you have a lot of readings over 200.  Please change the insulins to 16 units of each, both with breakfast.  On this type of insulin schedule, you should eat meals on a regular schedule (especially lunch).  If a meal is missed or significantly delayed, your blood sugar could go low.  Please come back for a follow-up appointment in 3 months.

## 2021-02-04 NOTE — Progress Notes (Signed)
Subjective:    Patient ID: Alicia Andrews, female    DOB: 08-Jul-1956, 65 y.o.   MRN: [id number]  HPI Pt returns for f/u of diabetes mellitus:  DM type: 1 Dx'ed: 1993 Complications: DR Therapy: insulin since 2020 GDM: never (G0) DKA: once (2021) Severe hypoglycemia: Last episode of was 2020 Pancreatitis: never Pancreatic imaging: never SDOH: she takes QD insulin, due to diabetes "burnout."  She takes human insulin, due to cost.   Interval history: no cbg record, but states cbg's vary from 65-185.  It is still in general lowest fasting, and highest in the afternoon.  She rarely misses the insulin.  Past Medical History:  Diagnosis Date   Diabetes mellitus without complication (HCC)    Type 2     No past surgical history on file.  Social History   Socioeconomic History   Marital status: Single    Spouse name: Not on file   Number of children: Not on file   Years of education: Not on file   Highest education level: Not on file  Occupational History   Not on file  Tobacco Use   Smoking status: Never   Smokeless tobacco: Never  Vaping Use   Vaping Use: Never used  Substance and Sexual Activity   Alcohol use: Not on file   Drug use: Not on file   Sexual activity: Not on file  Other Topics Concern   Not on file  Social History Narrative   Not on file   Social Determinants of Health   Financial Resource Strain: Not on file  Food Insecurity: Not on file  Transportation Needs: Not on file  Physical Activity: Not on file  Stress: Not on file  Social Connections: Not on file  Intimate Partner Violence: Not on file    Current Outpatient Medications on File Prior to Visit  Medication Sig Dispense Refill   acarbose (PRECOSE) 25 MG tablet Take 1 tablet (25 mg total) by mouth 3 (three) times daily with meals. 90 tablet 11   atorvastatin (LIPITOR) 10 MG tablet Take 1 tablet (10 mg total) by mouth daily. 90 tablet 0   No current facility-administered medications on  file prior to visit.    No Known Allergies  Family History  Problem Relation Age of Onset   Diabetes Father    Breast cancer Mother 27    BP (!) 144/72 (BP Location: Right Arm, Patient Position: Sitting, Cuff Size: Normal)   Pulse 88   Ht 5\' 1"  (1.549 m)   Wt 149 lb 8 oz (67.8 kg)   SpO2 99%   BMI 28.25 kg/m    Review of Systems Denies LOC.      Objective:   Physical Exam Pulses: dorsalis pedis intact bilat.   MSK: no deformity of the feet CV: no leg edema Skin:  no ulcer on the feet.  normal color and temp on the feet. Neuro: sensation is intact to touch on the feet  Lab Results  Component Value Date   HGBA1C 7.9 (A) 02/04/2021       Assessment & Plan:  Type 1 DM Hypoglycemia, due to insulin: Based on the pattern of her cbg's, she needs some adjustment in her therapy.   Patient Instructions  check your blood sugar twice a day.  vary the time of day when you check, between before the 3 meals, and at bedtime.  also check if you have symptoms of your blood sugar being too high or too low.  please  keep a record of the readings and bring it to your next appointment here (or you can bring the meter itself).  You can write it on any piece of paper.  please call us sooner if your blood sugar goes below 70, or if you have a lot of readings over 200.  Please change the insulins to 16 units of each, both with breakfast.  On this type of insulin schedule, you should eat meals on a regular schedule (especially lunch).  If a meal is missed or significantly delayed, your blood sugar could go low.  Please come back for a follow-up appointment in 3 months.

## 2021-02-23 ENCOUNTER — Other Ambulatory Visit: Payer: Self-pay

## 2021-02-23 ENCOUNTER — Encounter (INDEPENDENT_AMBULATORY_CARE_PROVIDER_SITE_OTHER): Payer: BC Managed Care – PPO | Admitting: Ophthalmology

## 2021-02-23 DIAGNOSIS — E113313 Type 2 diabetes mellitus with moderate nonproliferative diabetic retinopathy with macular edema, bilateral: Secondary | ICD-10-CM | POA: Diagnosis not present

## 2021-02-23 DIAGNOSIS — H43813 Vitreous degeneration, bilateral: Secondary | ICD-10-CM

## 2021-03-30 ENCOUNTER — Encounter (INDEPENDENT_AMBULATORY_CARE_PROVIDER_SITE_OTHER): Payer: BC Managed Care – PPO | Admitting: Ophthalmology

## 2021-03-30 ENCOUNTER — Other Ambulatory Visit: Payer: Self-pay

## 2021-03-30 DIAGNOSIS — E113313 Type 2 diabetes mellitus with moderate nonproliferative diabetic retinopathy with macular edema, bilateral: Secondary | ICD-10-CM | POA: Diagnosis not present

## 2021-03-30 DIAGNOSIS — H43813 Vitreous degeneration, bilateral: Secondary | ICD-10-CM | POA: Diagnosis not present

## 2021-05-06 ENCOUNTER — Encounter (INDEPENDENT_AMBULATORY_CARE_PROVIDER_SITE_OTHER): Payer: BC Managed Care – PPO | Admitting: Ophthalmology

## 2021-05-11 ENCOUNTER — Encounter (INDEPENDENT_AMBULATORY_CARE_PROVIDER_SITE_OTHER): Payer: BC Managed Care – PPO | Admitting: Ophthalmology

## 2021-05-11 ENCOUNTER — Other Ambulatory Visit: Payer: Self-pay

## 2021-05-11 DIAGNOSIS — H43813 Vitreous degeneration, bilateral: Secondary | ICD-10-CM

## 2021-05-11 DIAGNOSIS — E113313 Type 2 diabetes mellitus with moderate nonproliferative diabetic retinopathy with macular edema, bilateral: Secondary | ICD-10-CM | POA: Diagnosis not present

## 2021-05-12 ENCOUNTER — Other Ambulatory Visit (HOSPITAL_COMMUNITY): Payer: Self-pay

## 2021-05-12 ENCOUNTER — Telehealth: Payer: Self-pay | Admitting: Endocrinology

## 2021-05-12 ENCOUNTER — Ambulatory Visit (INDEPENDENT_AMBULATORY_CARE_PROVIDER_SITE_OTHER): Payer: BC Managed Care – PPO | Admitting: Endocrinology

## 2021-05-12 ENCOUNTER — Telehealth: Payer: Self-pay | Admitting: Pharmacy Technician

## 2021-05-12 VITALS — BP 140/76 | HR 91 | Ht 61.0 in | Wt 150.4 lb

## 2021-05-12 DIAGNOSIS — E1110 Type 2 diabetes mellitus with ketoacidosis without coma: Secondary | ICD-10-CM | POA: Diagnosis not present

## 2021-05-12 LAB — POCT GLYCOSYLATED HEMOGLOBIN (HGB A1C): Hemoglobin A1C: 8.1 % — AB (ref 4.0–5.6)

## 2021-05-12 MED ORDER — DEXCOM G6 TRANSMITTER MISC: 1.0000 | Freq: Once | 1 refills | Status: DC

## 2021-05-12 MED ORDER — NOVOLIN R FLEXPEN RELION 100 UNIT/ML IJ SOPN: 18.0000 [IU] | PEN_INJECTOR | Freq: Every day | INTRAMUSCULAR | 3 refills | Status: DC

## 2021-05-12 MED ORDER — DEXCOM G6 RECEIVER DEVI: 1.0000 | Freq: Once | 1 refills | Status: AC

## 2021-05-12 MED ORDER — DEXCOM G6 SENSOR MISC: 1.0000 | 3 refills | Status: DC

## 2021-05-12 NOTE — Telephone Encounter (Signed)
Patient Advocate Encounter   Received notification from [employer] that prior authorization for NOVOLIN R FLEXPEN is required.   PA submitted on 05/12/21 Key [id number] Status is pending    [facility] will continue to follow   Montez Morita, CPhT Patient Advocate [facility] Phone: [phone] Fax:  [phone]

## 2021-05-12 NOTE — Patient Instructions (Addendum)
check your blood sugar twice a day.  vary the time of day when you check, between before the 3 meals, and at bedtime.  also check if you have symptoms of your blood sugar being too high or too low.  please keep a record of the readings and bring it to your next appointment here (or you can bring the meter itself).  You can write it on any piece of paper.  please call us sooner if your blood sugar goes below 70, or if you have a lot of readings over 200.  Please increase the R insulin to 18 units, with breakfast.  On this type of insulin schedule, you should eat meals on a regular schedule (especially supper).  If a meal is missed or significantly delayed, your blood sugar could go low.  I have sent a prescription to your pharmacy, for the continuous glucose monitor Please come back for a follow-up appointment in 2-3 months.

## 2021-05-12 NOTE — Telephone Encounter (Addendum)
Patient Advocate Encounter   Received notification from [employer] that prior authorization for Dexcom is required.   PA submitted on 05/12/2021 Key [id number] Status is pending    [facility] will continue to follow:   Sherilyn Dacosta, CPhT Patient Advocate [facility] Phone: [phone] Fax:  [phone]

## 2021-05-12 NOTE — Progress Notes (Signed)
Subjective:    Patient ID: Alicia Andrews, female    DOB: 31-Jan-1956, 65 y.o.   MRN: [id number]  HPI Pt returns for f/u of diabetes mellitus:  DM type: 1 Dx'ed: 1993 Complications: DR Therapy: insulin since 2020 GDM: never (G0) DKA: once (2021) Severe hypoglycemia: Last episode of was 2020.   Pancreatitis: never Pancreatic imaging: never SDOH: she takes QD insulin, due to diabetes "burnout."  She takes human insulin, due to cost.   Interval history: no cbg record, but states cbg's vary from 51-248.  It is in general lowest before the evening meal, especially if it is delayed, and highest before lunch.  She never misses the insulin.  She has not recently taken acarbose.   Past Medical History:  Diagnosis Date   Diabetes mellitus without complication (HCC)    Type 2     No past surgical history on file.  Social History   Socioeconomic History   Marital status: Single    Spouse name: Not on file   Number of children: Not on file   Years of education: Not on file   Highest education level: Not on file  Occupational History   Not on file  Tobacco Use   Smoking status: Never   Smokeless tobacco: Never  Vaping Use   Vaping Use: Never used  Substance and Sexual Activity   Alcohol use: Not on file   Drug use: Not on file   Sexual activity: Not on file  Other Topics Concern   Not on file  Social History Narrative   Not on file   Social Determinants of Health   Financial Resource Strain: Not on file  Food Insecurity: Not on file  Transportation Needs: Not on file  Physical Activity: Not on file  Stress: Not on file  Social Connections: Not on file  Intimate Partner Violence: Not on file    Current Outpatient Medications on File Prior to Visit  Medication Sig Dispense Refill   atorvastatin (LIPITOR) 10 MG tablet Take 1 tablet (10 mg total) by mouth daily. 90 tablet 0   Insulin NPH, Human,, Isophane, (NOVOLIN N FLEXPEN RELION) 100 UNIT/ML Kiwkpen Inject 16  Units into the skin daily with breakfast. 15 mL 11   No current facility-administered medications on file prior to visit.    No Known Allergies  Family History  Problem Relation Age of Onset   Diabetes Father    Breast cancer Mother 79    BP 140/76 (BP Location: Right Arm, Patient Position: Sitting, Cuff Size: Normal)   Pulse 91   Ht 5\' 1"  (1.549 m)   Wt 150 lb 6.4 oz (68.2 kg)   SpO2 96%   BMI 28.42 kg/m   Review of Systems     Objective:   Physical Exam Pulses: dorsalis pedis intact bilat.   MSK: no deformity of the feet CV: no leg edema Skin:  no ulcer on the feet.  normal color and temp on the feet. Neuro: sensation is intact to touch on the feet.    Lab Results  Component Value Date   HGBA1C 8.1 (A) 05/12/2021       Assessment & Plan:  Type 1 DM: uncontrolled. Hypoglycemia, due to insulin: we'll increase insulin just slightly  Patient Instructions  check your blood sugar twice a day.  vary the time of day when you check, between before the 3 meals, and at bedtime.  also check if you have symptoms of your blood sugar being too high  or too low.  please keep a record of the readings and bring it to your next appointment here (or you can bring the meter itself).  You can write it on any piece of paper.  please call us sooner if your blood sugar goes below 70, or if you have a lot of readings over 200.  Please increase the R insulin to 18 units, with breakfast.  On this type of insulin schedule, you should eat meals on a regular schedule (especially supper).  If a meal is missed or significantly delayed, your blood sugar could go low.  I have sent a prescription to your pharmacy, for the continuous glucose monitor Please come back for a follow-up appointment in 2-3 months.

## 2021-05-12 NOTE — Telephone Encounter (Signed)
Pt needs refill of Atorvastatin. Prescription expired. Pt contact [phone], [facility] on [address].

## 2021-05-13 MED ORDER — NOVOLOG FLEXPEN RELION 100 UNIT/ML ~~LOC~~ SOPN: 18.0000 [IU] | PEN_INJECTOR | Freq: Every day | SUBCUTANEOUS | 11 refills | Status: DC

## 2021-05-13 NOTE — Telephone Encounter (Signed)
Message sent thru MyChart 

## 2021-05-13 NOTE — Telephone Encounter (Signed)
Called [employer] at [phone] and they stated that if the patient can be switched to generic Insulin Aspart (Novolog Flexpen), a prior authorization will not be required.  Netty Starring. Dimas Aguas, CPhT Patient Advocate [facility] Phone: [phone] Fax:  [phone]

## 2021-05-19 ENCOUNTER — Other Ambulatory Visit (HOSPITAL_COMMUNITY): Payer: Self-pay

## 2021-05-26 ENCOUNTER — Telehealth: Payer: Self-pay | Admitting: Endocrinology

## 2021-05-26 NOTE — Telephone Encounter (Signed)
Patient requests to be called at ph# [phone] re: Patient's blood sugars are either too low (40-lowest) or to high (292 highest)

## 2021-05-26 NOTE — Telephone Encounter (Signed)
Spoke with pt regarding reduce Novolog to 16 units, and increase NPH to 18 units, both with breakfast

## 2021-06-05 ENCOUNTER — Telehealth: Payer: Self-pay | Admitting: Pharmacy Technician

## 2021-06-05 ENCOUNTER — Other Ambulatory Visit (HOSPITAL_COMMUNITY): Payer: Self-pay

## 2021-06-05 NOTE — Telephone Encounter (Signed)
Patient Advocate Encounter   Received notification from [facility] that prior authorization for Novolin R Relion Flexpen is required.   Key: [id number]  Per Pharmacy pt already picked up.  Per test claim Novolin R and Humulin R vials are non-formulary. Unable to find a flexpen that's not Relion. No PA submitted at this time. Pt was able to get her medication.   Sherilyn Dacosta, CPhT Patient Advocate [facility] Phone: [phone] Fax:  [phone]

## 2021-06-10 ENCOUNTER — Encounter (INDEPENDENT_AMBULATORY_CARE_PROVIDER_SITE_OTHER): Payer: BC Managed Care – PPO | Admitting: Ophthalmology

## 2021-06-15 ENCOUNTER — Encounter (INDEPENDENT_AMBULATORY_CARE_PROVIDER_SITE_OTHER): Payer: BC Managed Care – PPO | Admitting: Ophthalmology

## 2021-06-18 ENCOUNTER — Other Ambulatory Visit: Payer: Self-pay

## 2021-06-18 ENCOUNTER — Encounter (INDEPENDENT_AMBULATORY_CARE_PROVIDER_SITE_OTHER): Payer: BC Managed Care – PPO | Admitting: Ophthalmology

## 2021-06-18 DIAGNOSIS — E113313 Type 2 diabetes mellitus with moderate nonproliferative diabetic retinopathy with macular edema, bilateral: Secondary | ICD-10-CM | POA: Diagnosis not present

## 2021-06-18 DIAGNOSIS — H43813 Vitreous degeneration, bilateral: Secondary | ICD-10-CM

## 2021-07-20 ENCOUNTER — Encounter (INDEPENDENT_AMBULATORY_CARE_PROVIDER_SITE_OTHER): Payer: BC Managed Care – PPO | Admitting: Ophthalmology

## 2021-07-20 ENCOUNTER — Other Ambulatory Visit: Payer: Self-pay

## 2021-07-20 DIAGNOSIS — H43813 Vitreous degeneration, bilateral: Secondary | ICD-10-CM

## 2021-07-20 DIAGNOSIS — E113313 Type 2 diabetes mellitus with moderate nonproliferative diabetic retinopathy with macular edema, bilateral: Secondary | ICD-10-CM | POA: Diagnosis not present

## 2021-07-22 ENCOUNTER — Ambulatory Visit (INDEPENDENT_AMBULATORY_CARE_PROVIDER_SITE_OTHER): Payer: BC Managed Care – PPO | Admitting: Endocrinology

## 2021-07-22 ENCOUNTER — Encounter: Payer: Self-pay | Admitting: Endocrinology

## 2021-07-22 ENCOUNTER — Other Ambulatory Visit: Payer: Self-pay

## 2021-07-22 VITALS — BP 142/80 | HR 91 | Ht 61.0 in | Wt 154.6 lb

## 2021-07-22 DIAGNOSIS — E1110 Type 2 diabetes mellitus with ketoacidosis without coma: Secondary | ICD-10-CM | POA: Diagnosis not present

## 2021-07-22 LAB — POCT GLYCOSYLATED HEMOGLOBIN (HGB A1C): Hemoglobin A1C: 7.6 % — AB (ref 4.0–5.6)

## 2021-07-22 MED ORDER — NOVOLIN R FLEXPEN 100 UNIT/ML IJ SOPN: 18.0000 [IU] | PEN_INJECTOR | Freq: Every day | INTRAMUSCULAR | 5 refills | Status: DC

## 2021-07-22 NOTE — Patient Instructions (Addendum)
check your blood sugar twice a day.  vary the time of day when you check, between before the 3 meals, and at bedtime.  also check if you have symptoms of your blood sugar being too high or too low.  please keep a record of the readings and bring it to your next appointment here (or you can bring the meter itself).  You can write it on any piece of paper.  please call us sooner if your blood sugar goes below 70, or if you have a lot of readings over 200.  Please verify that the NPH is 18 units and the R is 16.  Please let us know if this is not correct.   Then please change the insulins to the numbers listed below.   On this type of insulin schedule, you should eat meals on a regular schedule (especially supper).  If a meal is missed or significantly delayed, your blood sugar could go low.   Please come back for a follow-up appointment in 2-3 months.

## 2021-07-22 NOTE — Progress Notes (Signed)
Subjective:    Patient ID: Alicia Andrews, female    DOB: [birth year], 65 y.o.   MRN: [id number]  HPI Pt returns for f/u of diabetes mellitus:  DM type: 1 Dx'ed: 1993 Complications: DR Therapy: insulin since 2020 GDM: never (G0) DKA: once (2021) Severe hypoglycemia: Last episode of was 2020.   Pancreatitis: never Pancreatic imaging: never SDOH: she takes 2 QD insulins, due to diabetes "burnout."  She takes human insulin, due to cost.   Interval history: She says the NPH is 18 units and the R/Novolog is 16.  I reviewed continuous glucose monitor data.  Glucose varies from 55-300.  It is in general highest at 8-9AM, and lowest at 9-11PM.  However, there is little trend throughout the day. Past Medical History:  Diagnosis Date   Diabetes mellitus without complication (HCC)    Type 2     No past surgical history on file.  Social History   Socioeconomic History   Marital status: Single    Spouse name: Not on file   Number of children: Not on file   Years of education: Not on file   Highest education level: Not on file  Occupational History   Not on file  Tobacco Use   Smoking status: Never   Smokeless tobacco: Never  Vaping Use   Vaping Use: Never used  Substance and Sexual Activity   Alcohol use: Not on file   Drug use: Not on file   Sexual activity: Not on file  Other Topics Concern   Not on file  Social History Narrative   Not on file   Social Determinants of Health   Financial Resource Strain: Not on file  Food Insecurity: Not on file  Transportation Needs: Not on file  Physical Activity: Not on file  Stress: Not on file  Social Connections: Not on file  Intimate Partner Violence: Not on file    Current Outpatient Medications on File Prior to Visit  Medication Sig Dispense Refill   atorvastatin (LIPITOR) 10 MG tablet Take 1 tablet (10 mg total) by mouth daily. 90 tablet 0   Continuous Blood Gluc Sensor (DEXCOM G6 SENSOR) MISC 1 Device by Does not  apply route See admin instructions. Change every 10 days 9 each 3   Insulin NPH, Human,, Isophane, (NOVOLIN N FLEXPEN RELION) 100 UNIT/ML Kiwkpen Inject 16 Units into the skin daily with breakfast. 15 mL 11   No current facility-administered medications on file prior to visit.    No Known Allergies  Family History  Problem Relation Age of Onset   Diabetes Father    Breast cancer Mother 20    BP (!) 142/80   Pulse 91   Ht 5\' 1"  (1.549 m)   Wt 154 lb 9.6 oz (70.1 kg)   SpO2 98%   BMI 29.21 kg/m   Review of Systems     Objective:   Physical Exam    Lab Results  Component Value Date   HGBA1C 7.6 (A) 07/22/2021      Assessment & Plan:  Type 1 DM.  Hypoglycemia, due to insulin: Based on the pattern of her cbg's, she needs some adjustment in her therapy.   Patient Instructions  check your blood sugar twice a day.  vary the time of day when you check, between before the 3 meals, and at bedtime.  also check if you have symptoms of your blood sugar being too high or too low.  please keep a record of the readings  and bring it to your next appointment here (or you can bring the meter itself).  You can write it on any piece of paper.  please call us sooner if your blood sugar goes below 70, or if you have a lot of readings over 200.  Please verify that the NPH is 18 units and the R is 16.  Please let us know if this is not correct.   Then please change the insulins to the numbers listed below.   On this type of insulin schedule, you should eat meals on a regular schedule (especially supper).  If a meal is missed or significantly delayed, your blood sugar could go low.   Please come back for a follow-up appointment in 2-3 months.

## 2021-07-28 ENCOUNTER — Ambulatory Visit: Payer: BC Managed Care – PPO | Admitting: Endocrinology

## 2021-08-19 ENCOUNTER — Encounter (INDEPENDENT_AMBULATORY_CARE_PROVIDER_SITE_OTHER): Payer: BC Managed Care – PPO | Admitting: Ophthalmology

## 2021-08-19 ENCOUNTER — Other Ambulatory Visit: Payer: Self-pay

## 2021-08-19 DIAGNOSIS — E113313 Type 2 diabetes mellitus with moderate nonproliferative diabetic retinopathy with macular edema, bilateral: Secondary | ICD-10-CM

## 2021-08-19 DIAGNOSIS — H43813 Vitreous degeneration, bilateral: Secondary | ICD-10-CM

## 2021-09-20 ENCOUNTER — Other Ambulatory Visit: Payer: Self-pay | Admitting: Endocrinology

## 2021-09-23 ENCOUNTER — Other Ambulatory Visit: Payer: Self-pay

## 2021-09-23 ENCOUNTER — Ambulatory Visit (INDEPENDENT_AMBULATORY_CARE_PROVIDER_SITE_OTHER): Payer: BC Managed Care – PPO | Admitting: Endocrinology

## 2021-09-23 ENCOUNTER — Encounter: Payer: Self-pay | Admitting: Endocrinology

## 2021-09-23 VITALS — BP 140/80 | HR 85 | Ht 61.0 in | Wt 153.6 lb

## 2021-09-23 DIAGNOSIS — E1110 Type 2 diabetes mellitus with ketoacidosis without coma: Secondary | ICD-10-CM

## 2021-09-23 LAB — POCT GLYCOSYLATED HEMOGLOBIN (HGB A1C): Hemoglobin A1C: 7.6 % — AB (ref 4.0–5.6)

## 2021-09-23 NOTE — Patient Instructions (Addendum)
check your blood sugar twice a day.  vary the time of day when you check, between before the 3 meals, and at bedtime.  also check if you have symptoms of your blood sugar being too high or too low.  please keep a record of the readings and bring it to your next appointment here (or you can bring the meter itself).  You can write it on any piece of paper.  please call us sooner if your blood sugar goes below 70, or if you have a lot of readings over 200.   Please change the NPH to 18 units and the R to 16 units.   On this type of insulin schedule, you should eat meals on a regular schedule (especially supper).  If a meal is missed or significantly delayed, your blood sugar could go low.   Please come back for a follow-up appointment in 2-3 months.

## 2021-09-23 NOTE — Progress Notes (Signed)
Subjective:    Patient ID: Alicia Andrews, female    DOB: 1956-01-25, 66 y.o.   MRN: [id number]  HPI Pt returns for f/u of diabetes mellitus:  DM type: 1 Dx'ed: 123456 Complications: DR Therapy: insulin since 2020 GDM: never (G0) DKA: once (2021) Severe hypoglycemia: Last episode of was 2020.   Pancreatitis: never Pancreatic imaging: never SDOH: she takes 2 QD insulins, due to diabetes "burnout."  She takes human insulin, due to cost.  She takes insulins at 11AM.   Interval history: She says the NPH is 16 units and the R/Novolog is 18.  I reviewed continuous glucose monitor data.  Glucose varies from 55-340.  It is in general highest at 2-3PM, and lowest at 7-8PM.  It increases overnight.  She has mild hypoglycemia approx qd.  This usually happens in the afternoon.  Past Medical History:  Diagnosis Date   Diabetes mellitus without complication ([place])    Type 2     No past surgical history on file.  Social History   Socioeconomic History   Marital status: Single    Spouse name: Not on file   Number of children: Not on file   Years of education: Not on file   Highest education level: Not on file  Occupational History   Not on file  Tobacco Use   Smoking status: Never   Smokeless tobacco: Never  Vaping Use   Vaping Use: Never used  Substance and Sexual Activity   Alcohol use: Not on file   Drug use: Not on file   Sexual activity: Not on file  Other Topics Concern   Not on file  Social History Narrative   Not on file   Social Determinants of Health   Financial Resource Strain: Not on file  Food Insecurity: Not on file  Transportation Needs: Not on file  Physical Activity: Not on file  Stress: Not on file  Social Connections: Not on file  Intimate Partner Violence: Not on file    Current Outpatient Medications on File Prior to Visit  Medication Sig Dispense Refill   atorvastatin (LIPITOR) 10 MG tablet Take 1 tablet (10 mg total) by mouth daily. 90 tablet 0    Continuous Blood Gluc Sensor (DEXCOM G6 SENSOR) MISC 1 Device by Does not apply route See admin instructions. Change every 10 days 9 each 3   Insulin NPH, Human,, Isophane, (NOVOLIN N FLEXPEN RELION) 100 UNIT/ML Kiwkpen Inject 16 Units into the skin daily with breakfast. 15 mL 11   Insulin Regular Human (NOVOLIN R FLEXPEN RELION) 100 UNIT/ML KwikPen Inject 18 Units as directed daily with breakfast. 15 mL 5   RELION PEN NEEDLE 31G/8MM 31G X 8 MM MISC USE AS DIRECTED 50 each 0   No current facility-administered medications on file prior to visit.    No Known Allergies  Family History  Problem Relation Age of Onset   Diabetes Father    Breast cancer Mother 6    BP 140/80    Pulse 85    Ht 5\' 1"  (1.549 m)    Wt 153 lb 9.6 oz (69.7 kg)    SpO2 98%    BMI 29.02 kg/m    Review of Systems     Objective:   Physical Exam  Lab Results  Component Value Date   HGBA1C 7.6 (A) 09/23/2021      Assessment & Plan:  Insulin-requiring type 2 DM: uncontrolled Hypoglycemia, due to insulin: Based on the pattern of her cbg's, she  needs some adjustment in her therapy  Patient Instructions  check your blood sugar twice a day.  vary the time of day when you check, between before the 3 meals, and at bedtime.  also check if you have symptoms of your blood sugar being too high or too low.  please keep a record of the readings and bring it to your next appointment here (or you can bring the meter itself).  You can write it on any piece of paper.  please call us sooner if your blood sugar goes below 70, or if you have a lot of readings over 200.   Please change the NPH to 18 units and the R to 16 units.   On this type of insulin schedule, you should eat meals on a regular schedule (especially supper).  If a meal is missed or significantly delayed, your blood sugar could go low.   Please come back for a follow-up appointment in 2-3 months.

## 2021-10-08 ENCOUNTER — Encounter (INDEPENDENT_AMBULATORY_CARE_PROVIDER_SITE_OTHER): Payer: BC Managed Care – PPO | Admitting: Ophthalmology

## 2021-10-08 ENCOUNTER — Other Ambulatory Visit: Payer: Self-pay

## 2021-10-08 DIAGNOSIS — H43813 Vitreous degeneration, bilateral: Secondary | ICD-10-CM

## 2021-10-08 DIAGNOSIS — E113313 Type 2 diabetes mellitus with moderate nonproliferative diabetic retinopathy with macular edema, bilateral: Secondary | ICD-10-CM

## 2021-10-20 ENCOUNTER — Ambulatory Visit: Payer: BC Managed Care – PPO | Admitting: Nurse Practitioner

## 2021-10-29 ENCOUNTER — Encounter: Payer: Self-pay | Admitting: Orthopedic Surgery

## 2021-10-29 ENCOUNTER — Ambulatory Visit (INDEPENDENT_AMBULATORY_CARE_PROVIDER_SITE_OTHER): Payer: BC Managed Care – PPO | Admitting: Orthopedic Surgery

## 2021-10-29 ENCOUNTER — Other Ambulatory Visit: Payer: Self-pay

## 2021-10-29 VITALS — BP 126/84 | HR 82 | Temp 98.2°F | Resp 16 | Ht 62.0 in | Wt 149.8 lb

## 2021-10-29 DIAGNOSIS — E1169 Type 2 diabetes mellitus with other specified complication: Secondary | ICD-10-CM

## 2021-10-29 DIAGNOSIS — H3530 Unspecified macular degeneration: Secondary | ICD-10-CM

## 2021-10-29 DIAGNOSIS — Z6827 Body mass index (BMI) 27.0-27.9, adult: Secondary | ICD-10-CM

## 2021-10-29 DIAGNOSIS — K5901 Slow transit constipation: Secondary | ICD-10-CM

## 2021-10-29 DIAGNOSIS — M545 Low back pain, unspecified: Secondary | ICD-10-CM

## 2021-10-29 DIAGNOSIS — E1110 Type 2 diabetes mellitus with ketoacidosis without coma: Secondary | ICD-10-CM | POA: Diagnosis not present

## 2021-10-29 DIAGNOSIS — Z1159 Encounter for screening for other viral diseases: Secondary | ICD-10-CM

## 2021-10-29 DIAGNOSIS — E785 Hyperlipidemia, unspecified: Secondary | ICD-10-CM

## 2021-10-29 DIAGNOSIS — Z794 Long term (current) use of insulin: Secondary | ICD-10-CM

## 2021-10-29 DIAGNOSIS — R7989 Other specified abnormal findings of blood chemistry: Secondary | ICD-10-CM

## 2021-10-29 DIAGNOSIS — J302 Other seasonal allergic rhinitis: Secondary | ICD-10-CM

## 2021-10-29 DIAGNOSIS — G8929 Other chronic pain: Secondary | ICD-10-CM

## 2021-10-29 NOTE — Patient Instructions (Signed)
[facility] [phone]  ? ? ?

## 2021-10-29 NOTE — Progress Notes (Signed)
Careteam: Patient Care Team: Pcp, No as PCP - General Romero Belling, MD as Consulting Physician (Endocrinology)  Seen by: Hazle Nordmann, AGNP-C  PLACE OF SERVICE:  [facility]  Advanced Directive information Does Patient Have a Medical Advance Directive?: No, Would patient like information on creating a medical advance directive?: No - Patient declined  No Known Allergies  Chief Complaint  Patient presents with   Establish Care    Patient presents establishing care   Ask about medication bottles?     HPI: Patient is a 66 y.o. female seen today to [facility] at [facility].   Previous provider at [facility], last seen about 3 years ago. Medical records from previous PCP requested. Originally from [place]. Works in [employer]. Single.1 daughter. 3 cats and 3 dogs.  Likes to read and go antiquing.   Diabetes- diagnosed late 30's. Originally type 2 diabetes, notified she was type 1 in 2020 after hospitalized for DKA.  Followed by Dr. Everardo All endocrinology. Remains on NPH 18 units and regular insulin 16 units. Has a Dexcon meter. Sugars averaging 100's. No recent hypoglycemias. Reports her sugar have been in 40's in past, she has glucose tablets. Denies peripheral neuropathy. Reports being diagnosed with diabetic retinopathy. Foot exams done by Dr. Everardo All.   HLD- past use of atorvastatin but not at this time  Low back pain- intermittent back pain near waistline, will use ibuprofen 400 mg prn  Depression- best friend passed away 5 years ago, she was on SSRI during that time, not on medication at this time  Macular degeneration both eyes- injections to both eyes for last 18 months per Dr. Ashley Royalty  Seasonal allergies- uses allegra prn  Abnormal thyroid- reports past use of Synthroid, off medication at this time  Constipation- going about once a week, admits to not drinking enough water  Procedures:  Colonoscopy- never done, no family history  of colon cancer,denies blood in stool  Mammogram- 2022, normal  Bone density- never done  PAP- cannot remember last time done  Family history:  Father- deceased age 53- diabetes/ heart complications  Mother- deceased age 57- breast cancer/ hx: asthma  Brother- alive age 20, does not know health hx  Brother- alive age 32, does not know health hx  Immunizations:  Flu- not UTD  Covid- completed initial series and 2 booster vaccines  Pneumococcal 23- not done  Shingles- not done  Tdap- done 2018  Eyes exam- Dr. Alan Mulder, seen last month, diagnosed with diabetic retinopathy and macular degeneration- receiving monthly injections  Dental exam- no dental exam in past 2-3 years  Diet: 1 cup coffee daily, does not follow specific diet Exercise: does not follow regular exercise routine  Smoking- never smoked, past exposure to second hand smoke as child Alcohol- occasional drink every 6 months, no past abuse Drugs- no past use or abuse  Advanced directives not complete.     Review of Systems:  Review of Systems  Constitutional:  Positive for malaise/fatigue. Negative for chills, fever and weight loss.  HENT:  Positive for congestion. Negative for sore throat.   Eyes:  Negative for blurred vision and double vision.  Respiratory:  Negative for cough, shortness of breath and wheezing.   Cardiovascular:  Negative for chest pain and leg swelling.  Gastrointestinal:  Positive for constipation. Negative for abdominal pain, blood in stool, diarrhea, heartburn, nausea and vomiting.  Genitourinary:  Negative for dysuria, frequency and hematuria.  Musculoskeletal:  Positive for back pain. Negative  for falls and joint pain.  Skin:  Negative for rash.  Neurological:  Negative for dizziness, sensory change, weakness and headaches.  Endo/Heme/Allergies:  Positive for environmental allergies. Negative for polydipsia.  Psychiatric/Behavioral:  Negative for depression and substance abuse. The  patient is not nervous/anxious and does not have insomnia.    Past Medical History:  Diagnosis Date   Diabetes mellitus without complication (HCC)    Type 2    Past Surgical History:  Procedure Laterality Date   DIAGNOSTIC MAMMOGRAM  2022   Per new patient packet   TONSILLECTOMY AND ADENOIDECTOMY  1963   Per new patient packet   Social History:   reports that she has never smoked. She has never used smokeless tobacco. She reports that she does not drink alcohol and does not use drugs.  Family History  Problem Relation Age of Onset   Breast cancer Mother 49   Diabetes Father    Cancer Maternal Grandmother    Dementia Maternal Grandmother     Medications: Patient's Medications  New Prescriptions   No medications on file  Previous Medications   INSULIN NPH, HUMAN,, ISOPHANE, (NOVOLIN N FLEXPEN RELION) 100 UNIT/ML KIWKPEN    Inject 16 Units into the skin daily with breakfast.   INSULIN REGULAR HUMAN (NOVOLIN R FLEXPEN RELION) 100 UNIT/ML KWIKPEN    Inject 18 Units as directed daily with breakfast.  Modified Medications   No medications on file  Discontinued Medications   No medications on file    Physical Exam:  There were no vitals filed for this visit. There is no height or weight on file to calculate BMI. Wt Readings from Last 3 Encounters:  09/23/21 153 lb 9.6 oz (69.7 kg)  07/22/21 154 lb 9.6 oz (70.1 kg)  05/12/21 150 lb 6.4 oz (68.2 kg)    Physical Exam Vitals reviewed.  Constitutional:      General: She is not in acute distress. HENT:     Head: Normocephalic.  Eyes:     General:        Right eye: No discharge.        Left eye: No discharge.  Neck:     Vascular: No carotid bruit.  Cardiovascular:     Rate and Rhythm: Normal rate and regular rhythm.     Pulses: Normal pulses.     Heart sounds: Normal heart sounds.  Pulmonary:     Effort: Pulmonary effort is normal.     Breath sounds: Normal breath sounds.  Abdominal:     General: Bowel sounds are  normal. There is no distension.     Palpations: Abdomen is soft.     Tenderness: There is no abdominal tenderness.  Musculoskeletal:     Cervical back: Neck supple.     Right lower leg: No edema.     Left lower leg: No edema.  Lymphadenopathy:     Cervical: No cervical adenopathy.  Skin:    General: Skin is warm and dry.     Capillary Refill: Capillary refill takes less than 2 seconds.  Neurological:     General: No focal deficit present.     Mental Status: She is alert and oriented to person, place, and time.  Psychiatric:        Mood and Affect: Mood normal.        Behavior: Behavior normal.    Labs reviewed: Basic Metabolic Panel: No results for input(s): NA, K, CL, CO2, GLUCOSE, BUN, CREATININE, CALCIUM, MG, PHOS, TSH in the last  8760 hours. Liver Function Tests: No results for input(s): AST, ALT, ALKPHOS, BILITOT, PROT, ALBUMIN in the last 8760 hours. No results for input(s): LIPASE, AMYLASE in the last 8760 hours. No results for input(s): AMMONIA in the last 8760 hours. CBC: No results for input(s): WBC, NEUTROABS, HGB, HCT, MCV, PLT in the last 8760 hours. Lipid Panel: No results for input(s): CHOL, HDL, LDLCALC, TRIG, CHOLHDL, LDLDIRECT in the last 8760 hours. TSH: No results for input(s): TSH in the last 8760 hours. A1C: Lab Results  Component Value Date   HGBA1C 7.6 (A) 09/23/2021     Assessment/Plan 1. Type 2 diabetes mellitus with ketoacidosis without coma, with long-term current use of insulin (HCC) - followed by Dr. Everardo All - A1c 7.6 02/08 - cont NPH and regular insulin - eye exam last month - CBC with Differential/Platelet; Future - CMP; Future - Microalbumin/Creatinine Ratio, Urine; Future  2. Hyperlipidemia associated with type 2 diabetes mellitus (HCC) - LDL unknown - reports past use of atorvastatin - Lipid panel; Future  3. Chronic midline low back pain without sciatica - intermittent - cont ibuprofen prn  4. Macular degeneration of both  eyes, unspecified type - followed by Dr. Ashley Royalty  5. Slow transit constipation - going weekly - advised to increase water intake - recommend introducing more fruits/vegetables in diet  6. Seasonal allergies - cont Allegra prn  7. BMI 27.0-27.9,adult  8. Abnormal thyroid stimulating hormone (TSH) level - past use of Synthroid - reports some fatigue - TSH; Future  9. Need for hepatitis C screening test - Hep C Antibody; Future  Total time: 39 minutes. Greater than 50% of total time spent doing patient education regarding diabetes management and health maintenance.   Future labs/tests: discuss colonoscopy and PAP next visit   Next appt: 11/19/2021  Hazle Nordmann, Juel Burrow  [facility] [phone]

## 2021-11-05 ENCOUNTER — Encounter (INDEPENDENT_AMBULATORY_CARE_PROVIDER_SITE_OTHER): Payer: BC Managed Care – PPO | Admitting: Ophthalmology

## 2021-11-09 ENCOUNTER — Other Ambulatory Visit: Payer: Self-pay

## 2021-11-09 ENCOUNTER — Encounter (INDEPENDENT_AMBULATORY_CARE_PROVIDER_SITE_OTHER): Payer: BC Managed Care – PPO | Admitting: Ophthalmology

## 2021-11-09 DIAGNOSIS — H43813 Vitreous degeneration, bilateral: Secondary | ICD-10-CM

## 2021-11-09 DIAGNOSIS — E113313 Type 2 diabetes mellitus with moderate nonproliferative diabetic retinopathy with macular edema, bilateral: Secondary | ICD-10-CM | POA: Diagnosis not present

## 2021-11-12 ENCOUNTER — Other Ambulatory Visit: Payer: Self-pay | Admitting: Orthopedic Surgery

## 2021-11-12 ENCOUNTER — Ambulatory Visit (INDEPENDENT_AMBULATORY_CARE_PROVIDER_SITE_OTHER): Payer: BC Managed Care – PPO | Admitting: Endocrinology

## 2021-11-12 ENCOUNTER — Other Ambulatory Visit: Payer: Self-pay

## 2021-11-12 ENCOUNTER — Encounter: Payer: Self-pay | Admitting: Endocrinology

## 2021-11-12 ENCOUNTER — Other Ambulatory Visit: Payer: BC Managed Care – PPO

## 2021-11-12 VITALS — BP 128/70 | HR 89 | Ht 62.0 in | Wt 150.2 lb

## 2021-11-12 DIAGNOSIS — R7989 Other specified abnormal findings of blood chemistry: Secondary | ICD-10-CM

## 2021-11-12 DIAGNOSIS — E1169 Type 2 diabetes mellitus with other specified complication: Secondary | ICD-10-CM

## 2021-11-12 DIAGNOSIS — E1110 Type 2 diabetes mellitus with ketoacidosis without coma: Secondary | ICD-10-CM

## 2021-11-12 DIAGNOSIS — Z794 Long term (current) use of insulin: Secondary | ICD-10-CM

## 2021-11-12 DIAGNOSIS — Z1159 Encounter for screening for other viral diseases: Secondary | ICD-10-CM

## 2021-11-12 MED ORDER — NOVOLIN R FLEXPEN 100 UNIT/ML IJ SOPN: 18.0000 [IU] | PEN_INJECTOR | Freq: Every day | INTRAMUSCULAR | 5 refills | Status: DC

## 2021-11-12 MED ORDER — NOVOLIN N FLEXPEN RELION 100 UNIT/ML ~~LOC~~ SUPN: 16.0000 [IU] | PEN_INJECTOR | Freq: Every day | SUBCUTANEOUS | 11 refills | Status: DC

## 2021-11-12 NOTE — Patient Instructions (Addendum)
check your blood sugar twice a day.  vary the time of day when you check, between before the 3 meals, and at bedtime.  also check if you have symptoms of your blood sugar being too high or too low.  please keep a record of the readings and bring it to your next appointment here (or you can bring the meter itself).  You can write it on any piece of paper.  please call us sooner if your blood sugar goes below 70, or if you have a lot of readings over 200.   ?Please change the NPH to 16 units and the R to 18 units.  You should also try to take these a little earlier, such as 10AM.   ?On this type of insulin schedule, you should eat meals on a regular schedule (especially supper).  If a meal is missed or significantly delayed, your blood sugar could go low.   ?Please come back for a follow-up appointment in 3 months.   ?

## 2021-11-12 NOTE — Progress Notes (Signed)
? ?Subjective:  ? ? Patient ID: Alicia Andrews, female    DOB: 1956/08/14, 66 y.o.   MRN: [id number] ? ?HPI ?Pt returns for f/u of diabetes mellitus:  ?DM type: 1 ?Dx'ed: 1993 ?Complications: DR ?Therapy: insulin since 2020 ?GDM: never (G0) ?DKA: once (2021) ?Severe hypoglycemia: Last episode of was 2020.   ?Pancreatitis: never ?Pancreatic imaging: never ?SDOH: she takes 2 QD insulins rather than MDI, due to diabetes "burnout."  She takes human insulin, due to cost.  She takes insulins at 11AM.   ?Interval history: I reviewed continuous glucose monitor data.  Glucose varies from 40-320.  It is in general highest at 1-2PM, and lowest at 8PM.  It increases 1AM-1PM, then decreases.  She has mild hypoglycemia approx QOD.  This again usually happens in the afternoon.   ?Past Medical History:  ?Diagnosis Date  ? Diabetes mellitus without complication (HCC)   ? Type 2   ? ? ?Past Surgical History:  ?Procedure Laterality Date  ? DIAGNOSTIC MAMMOGRAM  2022  ? Per new patient packet  ? TONSILLECTOMY AND ADENOIDECTOMY  1963  ? Per new patient packet  ? ? ?Social History  ? ?Socioeconomic History  ? Marital status: Single  ?  Spouse name: Not on file  ? Number of children: Not on file  ? Years of education: Not on file  ? Highest education level: Not on file  ?Occupational History  ? Not on file  ?Tobacco Use  ? Smoking status: Never  ? Smokeless tobacco: Never  ?Vaping Use  ? Vaping Use: Never used  ?Substance and Sexual Activity  ? Alcohol use: Never  ? Drug use: Never  ? Sexual activity: Not on file  ?Other Topics Concern  ? Not on file  ?Social History Narrative  ? Diet: Left blank  ?   ? Caffeine: Yes  ?   ? Married, if yes what year: Single  ?   ? Do you live in a house, apartment, assisted living, condo, trailer, ect: House  ?   ? Is it one or more stories: 1  ?   ? How many persons live in your home? 2  ?   ? Pets: Yes, 3 cats and 3 dogs   ?   ? Highest level or education completed: BA  ?   ? Current/Past  profession: Banking  ?   ? Exercise: No                 Type and how often:   ?   ?   ? Living Will: No  ? DNR: No  ? POA/HPOA: No  ?   ? Functional Status:  ? Do you have difficulty bathing or dressing yourself? No  ? Do you have difficulty preparing food or eating? No  ? Do you have difficulty managing your medications? No  ? Do you have difficulty managing your finances? No  ? Do you have difficulty affording your medications? No  ? ?Social Determinants of Health  ? ?Financial Resource Strain: Not on file  ?Food Insecurity: Not on file  ?Transportation Needs: Not on file  ?Physical Activity: Not on file  ?Stress: Not on file  ?Social Connections: Not on file  ?Intimate Partner Violence: Not on file  ? ? ?No current outpatient medications on file prior to visit.  ? ?No current facility-administered medications on file prior to visit.  ? ? ?No Known Allergies ? ?Family History  ?Problem Relation Age of Onset  ?  Breast cancer Mother 44  ? Diabetes Father   ? Cancer Maternal Grandmother   ? Dementia Maternal Grandmother   ? ? ?BP 128/70 (BP Location: Left Arm, Patient Position: Sitting, Cuff Size: Normal)   Pulse 89   Ht 5\' 2"  (1.575 m)   Wt 150 lb 3.2 oz (68.1 kg)   SpO2 97%   BMI 27.47 kg/m?  ? ? ?Review of Systems ? ?   ?Objective:  ? Physical Exam ?VITAL SIGNS:  See vs page.   ?GENERAL: no distress.   ? ?Lab Results  ?Component Value Date  ? HGBA1C 7.6 (A) 09/23/2021  ? ?   ?Assessment & Plan:  ?Type 1 DM ?Hypoglycemia, due to insulin: Based on the pattern of her cbg's, she needs some adjustment in her therapy.  ? ?Patient Instructions  ?check your blood sugar twice a day.  vary the time of day when you check, between before the 3 meals, and at bedtime.  also check if you have symptoms of your blood sugar being too high or too low.  please keep a record of the readings and bring it to your next appointment here (or you can bring the meter itself).  You can write it on any piece of paper.  please call 11/21/2021  sooner if your blood sugar goes below 70, or if you have a lot of readings over 200.   ?Please change the NPH to 16 units and the R to 18 units.  You should also try to take these a little earlier, such as 10AM.   ?On this type of insulin schedule, you should eat meals on a regular schedule (especially supper).  If a meal is missed or significantly delayed, your blood sugar could go low.   ?Please come back for a follow-up appointment in 3 months.   ? ? ?

## 2021-11-13 LAB — LIPID PANEL
Cholesterol: 246 mg/dL — ABNORMAL HIGH (ref <200)
HDL: 76 mg/dL (ref >=50)
LDL Cholesterol (Calc): 155 mg/dL (calc) — ABNORMAL HIGH
Non-HDL Cholesterol (Calc): 170 mg/dL (calc) — ABNORMAL HIGH (ref <130)
Total CHOL/HDL Ratio: 3.2 (calc) (ref <5.0)
Triglycerides: 60 mg/dL (ref <150)

## 2021-11-13 LAB — COMPREHENSIVE METABOLIC PANEL
AG Ratio: 1.5 (calc) (ref 1.0–2.5)
ALT: 13 U/L (ref 6–29)
AST: 14 U/L (ref 10–35)
Albumin: 4.4 g/dL (ref 3.6–5.1)
Alkaline phosphatase (APISO): 90 U/L (ref 37–153)
BUN/Creatinine Ratio: 32 (calc) — ABNORMAL HIGH (ref 6–22)
BUN: 26 mg/dL — ABNORMAL HIGH (ref 7–25)
CO2: 28 mmol/L (ref 20–32)
Calcium: 9.9 mg/dL (ref 8.6–10.4)
Chloride: 106 mmol/L (ref 98–110)
Creat: 0.82 mg/dL (ref 0.50–1.05)
Globulin: 2.9 g/dL (calc) (ref 1.9–3.7)
Glucose, Bld: 144 mg/dL — ABNORMAL HIGH (ref 65–99)
Potassium: 4.8 mmol/L (ref 3.5–5.3)
Sodium: 140 mmol/L (ref 135–146)
Total Bilirubin: 0.3 mg/dL (ref 0.2–1.2)
Total Protein: 7.3 g/dL (ref 6.1–8.1)

## 2021-11-13 LAB — CBC WITH DIFFERENTIAL/PLATELET
Absolute Monocytes: 418 cells/uL (ref 200–950)
Basophils Absolute: 72 cells/uL (ref 0–200)
Basophils Relative: 1 %
Eosinophils Absolute: 238 cells/uL (ref 15–500)
Eosinophils Relative: 3.3 %
HCT: 33.3 % — ABNORMAL LOW (ref 35.0–45.0)
Hemoglobin: 11.2 g/dL — ABNORMAL LOW (ref 11.7–15.5)
Lymphs Abs: 1282 cells/uL (ref 850–3900)
MCH: 31.7 pg (ref 27.0–33.0)
MCHC: 33.6 g/dL (ref 32.0–36.0)
MCV: 94.3 fL (ref 80.0–100.0)
MPV: 9.9 fL (ref 7.5–12.5)
Monocytes Relative: 5.8 %
Neutro Abs: 5191 cells/uL (ref 1500–7800)
Neutrophils Relative %: 72.1 %
Platelets: 338 10*3/uL (ref 140–400)
RBC: 3.53 10*6/uL — ABNORMAL LOW (ref 3.80–5.10)
RDW: 12.6 % (ref 11.0–15.0)
Total Lymphocyte: 17.8 %
WBC: 7.2 10*3/uL (ref 3.8–10.8)

## 2021-11-13 LAB — MICROALBUMIN / CREATININE URINE RATIO
Creatinine, Urine: 157 mg/dL (ref 20–275)
Microalb Creat Ratio: 8 mcg/mg creat (ref <30)
Microalb, Ur: 1.3 mg/dL

## 2021-11-13 LAB — HEPATITIS C ANTIBODY
Hepatitis C Ab: NONREACTIVE
SIGNAL TO CUT-OFF: 0.09 (ref <1.00)

## 2021-11-13 LAB — TSH: TSH: 4.55 mIU/L — ABNORMAL HIGH (ref 0.40–4.50)

## 2021-11-15 ENCOUNTER — Other Ambulatory Visit: Payer: Self-pay | Admitting: Orthopedic Surgery

## 2021-11-15 DIAGNOSIS — E1169 Type 2 diabetes mellitus with other specified complication: Secondary | ICD-10-CM

## 2021-11-15 MED ORDER — ROSUVASTATIN CALCIUM 20 MG PO TABS: 20.0000 mg | ORAL_TABLET | Freq: Every day | ORAL | 3 refills | Status: DC

## 2021-11-16 ENCOUNTER — Other Ambulatory Visit: Payer: BC Managed Care – PPO

## 2021-11-19 ENCOUNTER — Ambulatory Visit: Payer: BC Managed Care – PPO | Admitting: Orthopedic Surgery

## 2021-11-25 ENCOUNTER — Encounter: Payer: Self-pay | Admitting: Orthopedic Surgery

## 2021-11-26 ENCOUNTER — Encounter: Payer: Self-pay | Admitting: Orthopedic Surgery

## 2021-11-26 ENCOUNTER — Ambulatory Visit (INDEPENDENT_AMBULATORY_CARE_PROVIDER_SITE_OTHER): Payer: BC Managed Care – PPO | Admitting: Orthopedic Surgery

## 2021-11-26 VITALS — BP 118/64 | HR 91 | Temp 97.4°F | Ht 62.0 in | Wt 148.6 lb

## 2021-11-26 DIAGNOSIS — E1110 Type 2 diabetes mellitus with ketoacidosis without coma: Secondary | ICD-10-CM | POA: Diagnosis not present

## 2021-11-26 DIAGNOSIS — Z5320 Procedure and treatment not carried out because of patient's decision for unspecified reasons: Secondary | ICD-10-CM

## 2021-11-26 DIAGNOSIS — E1169 Type 2 diabetes mellitus with other specified complication: Secondary | ICD-10-CM

## 2021-11-26 DIAGNOSIS — Z794 Long term (current) use of insulin: Secondary | ICD-10-CM

## 2021-11-26 DIAGNOSIS — E785 Hyperlipidemia, unspecified: Secondary | ICD-10-CM

## 2021-11-26 DIAGNOSIS — Z1231 Encounter for screening mammogram for malignant neoplasm of breast: Secondary | ICD-10-CM | POA: Diagnosis not present

## 2021-11-26 DIAGNOSIS — R7989 Other specified abnormal findings of blood chemistry: Secondary | ICD-10-CM

## 2021-11-26 NOTE — Patient Instructions (Addendum)
Increase intake of iron rich foods ? ?May call [facility] center of [facility] to schedule mammogram- anytime after  ? ?Schedule lab visit in 3 months to recheck cholesterol.  ?

## 2021-11-26 NOTE — Progress Notes (Signed)
? ? ?Careteam: ?Patient Care Team: ?Octavia HeirFargo, Jullie Arps E, NP as PCP - General (Adult Health Nurse Practitioner) ?Romero BellingEllison, Sean, MD as Consulting Physician (Endocrinology) ? ?Seen by: Hazle NordmannAmy Jawon Dipiero, AGNP-C ? ?PLACE OF SERVICE:  ?[facility]  ?Advanced Directive information ?Does Patient Have a Medical Advance Directive?: No, Would patient like information on creating a medical advance directive?: No - Patient declined ? ?No Known Allergies ? ?Chief Complaint  ?Patient presents with  ? Follow-up  ?  Patient is present today for 4 week follow up.   ? Quality Metric Gaps  ?  Discuss the need for eye exam, pap smear, colonoscopy, and Shingrix, or post pone is patient refuses.   ? ? ? ?HPI: Patient is a 66 y.o. female seen today for medical management of chronic conditions.  ? ?Labs reviewed with patient.  ? ?No health concerns today.  ? ?Follows Dr. Everardo AllEllison for diabetes. A1c reduced to 7. No hypoglycemic events. Next eye exam 12/14/2021.  ? ?LDL elevated at 151. She has started taking crestor, denies SE.  ? ?Hemoglobin 11.2 11/12/2021. Hx of donating blood in past > 1 year. Denies hematuria, black tarry stools, coffee emesis.  ? ?She does not want colonoscopy at this time. No changes in bowel habits. Will consider cologurard at the end of the year.  ? ?Need for PAP smear discussed. She has not had one in 15 years. Maternal grandmother had ovarian cancer. Denies abdominal pain, bloating or abnormal bleeding.  ? ?Orders for mammogram placed.  ? ? ?Review of Systems:  ?Review of Systems  ?Constitutional:  Negative for chills, fever, malaise/fatigue and weight loss.  ?HENT: Negative.    ?Eyes: Negative.   ?Respiratory:  Negative for cough, shortness of breath and wheezing.   ?Cardiovascular:  Negative for chest pain and leg swelling.  ?Gastrointestinal: Negative.   ?Genitourinary: Negative.   ?Musculoskeletal: Negative.   ?Skin: Negative.   ?Neurological: Negative.   ?Endo/Heme/Allergies:  Negative for polydipsia.   ?Psychiatric/Behavioral: Negative.    ? ?Past Medical History:  ?Diagnosis Date  ? Diabetes mellitus without complication (HCC)   ? Type 2   ? ?Past Surgical History:  ?Procedure Laterality Date  ? DIAGNOSTIC MAMMOGRAM  2022  ? Per new patient packet  ? TONSILLECTOMY AND ADENOIDECTOMY  1963  ? Per new patient packet  ? ?Social History: ?  reports that she has never smoked. She has never used smokeless tobacco. She reports that she does not drink alcohol and does not use drugs. ? ?Family History  ?Problem Relation Age of Onset  ? Breast cancer Mother [id number]  ? Diabetes Father   ? Cancer Maternal Grandmother   ? Dementia Maternal Grandmother   ? ? ?Medications: ?Patient's Medications  ?New Prescriptions  ? No medications on file  ?Previous Medications  ? INSULIN NPH, HUMAN,, ISOPHANE, (NOVOLIN N FLEXPEN RELION) 100 UNIT/ML KIWKPEN    Inject 16 Units into the skin daily with breakfast.  ? INSULIN REGULAR HUMAN (NOVOLIN R FLEXPEN RELION) 100 UNIT/ML KWIKPEN    Inject 18 Units as directed daily with breakfast.  ? ROSUVASTATIN (CRESTOR) 20 MG TABLET    Take 1 tablet (20 mg total) by mouth daily.  ?Modified Medications  ? No medications on file  ?Discontinued Medications  ? No medications on file  ? ? ?Physical Exam: ? ?Vitals:  ? 11/26/21 1036  ?BP: 118/64  ?Pulse: 91  ?Temp: (!) 97.4 ?F (36.3 ?C)  ?TempSrc: Temporal  ?SpO2: 99%  ?Weight: 148 lb 9.6 oz (67.4 kg)  ?  Height: 5\' 2"  (1.575 m)  ? ?Body mass index is 27.18 kg/m?. ?Wt Readings from Last 3 Encounters:  ?11/26/21 148 lb 9.6 oz (67.4 kg)  ?11/12/21 150 lb 3.2 oz (68.1 kg)  ?10/29/21 149 lb 12.8 oz (67.9 kg)  ? ? ?Physical Exam ?Vitals reviewed.  ?Constitutional:   ?   General: She is not in acute distress. ?HENT:  ?   Head: Normocephalic.  ?Eyes:  ?   General:     ?   Right eye: No discharge.     ?   Left eye: No discharge.  ?Neck:  ?   Vascular: No carotid bruit.  ?Cardiovascular:  ?   Rate and Rhythm: Normal rate and regular rhythm.  ?   Pulses: Normal pulses.  ?    Heart sounds: Normal heart sounds. No murmur heard. ?Pulmonary:  ?   Effort: Pulmonary effort is normal. No respiratory distress.  ?   Breath sounds: Normal breath sounds. No wheezing.  ?Abdominal:  ?   General: Bowel sounds are normal. There is no distension.  ?   Palpations: Abdomen is soft.  ?   Tenderness: There is no abdominal tenderness.  ?Musculoskeletal:  ?   Cervical back: Neck supple.  ?   Right lower leg: No edema.  ?   Left lower leg: No edema.  ?Lymphadenopathy:  ?   Cervical: No cervical adenopathy.  ?Skin: ?   General: Skin is warm and dry.  ?   Capillary Refill: Capillary refill takes less than 2 seconds.  ?Neurological:  ?   General: No focal deficit present.  ?   Mental Status: She is alert and oriented to person, place, and time.  ?Psychiatric:     ?   Mood and Affect: Mood normal.     ?   Behavior: Behavior normal.  ? ? ?Labs reviewed: ?Basic Metabolic Panel: ?Recent Labs  ?  11/12/21 ?1132  ?NA 140  ?K 4.8  ?CL 106  ?CO2 28  ?GLUCOSE 144*  ?BUN 26*  ?CREATININE 0.82  ?CALCIUM 9.9  ?TSH 4.55*  ? ?Liver Function Tests: ?Recent Labs  ?  11/12/21 ?1132  ?AST 14  ?ALT 13  ?BILITOT 0.3  ?PROT 7.3  ? ?No results for input(s): LIPASE, AMYLASE in the last 8760 hours. ?No results for input(s): AMMONIA in the last 8760 hours. ?CBC: ?Recent Labs  ?  11/12/21 ?1132  ?WBC 7.2  ?NEUTROABS 5,191  ?HGB 11.2*  ?HCT 33.3*  ?MCV 94.3  ?PLT 338  ? ?Lipid Panel: ?Recent Labs  ?  11/12/21 ?1132  ?CHOL 246*  ?HDL 76  ?LDLCALC 155*  ?TRIG 60  ?CHOLHDL 3.2  ? ?TSH: ?Recent Labs  ?  11/12/21 ?1132  ?TSH 4.55*  ? ?A1C: ?Lab Results  ?Component Value Date  ? HGBA1C 7.6 (A) 09/23/2021  ? ? ? ?Assessment/Plan ?1. Breast cancer screening by mammogram ?- MM Digital Screening; Future ? ?2. Type 2 diabetes mellitus with ketoacidosis without coma, with long-term current use of insulin (HCC) ?- A1c 7.6 09/23/2021 ?- micro albumin ratio < 8 11/12/2021 ?- no hypoglycemias ?- followed by Dr. 11/14/2021 ? ?3. Hyperlipidemia associated  with type 2 diabetes mellitus (HCC) ?- LDL 155 11/12/2021 ?- started on Crestor ?- lipid panel in 3 months ? ?4. Abnormal thyroid stimulating hormone (TSH) level ?- TSH 4.55 03/30 ?- recommend having T3 and T4 in future ? ?5. Colonoscopy refused ?- plans to discuss cologuard next visit ? ?Total time: 31 minutes. Greater than 50% of  total time spent doing patient education regarding health maintenance, diabetes, cholesterol and abnormal thyroid labs.  ? ? ?Next appt: Visit date not found  ?Atonya Templer Coletta Memos, AGNP ? ?[facility] ?[phone]  ?

## 2021-11-29 ENCOUNTER — Other Ambulatory Visit: Payer: Self-pay | Admitting: Endocrinology

## 2021-12-10 ENCOUNTER — Encounter (INDEPENDENT_AMBULATORY_CARE_PROVIDER_SITE_OTHER): Payer: BC Managed Care – PPO | Admitting: Ophthalmology

## 2021-12-14 ENCOUNTER — Encounter (INDEPENDENT_AMBULATORY_CARE_PROVIDER_SITE_OTHER): Payer: BC Managed Care – PPO | Admitting: Ophthalmology

## 2021-12-14 DIAGNOSIS — E113313 Type 2 diabetes mellitus with moderate nonproliferative diabetic retinopathy with macular edema, bilateral: Secondary | ICD-10-CM

## 2021-12-14 DIAGNOSIS — H43813 Vitreous degeneration, bilateral: Secondary | ICD-10-CM | POA: Diagnosis not present

## 2021-12-29 ENCOUNTER — Other Ambulatory Visit (HOSPITAL_COMMUNITY): Payer: Self-pay

## 2022-01-05 ENCOUNTER — Other Ambulatory Visit (HOSPITAL_COMMUNITY): Payer: Self-pay

## 2022-01-05 ENCOUNTER — Telehealth: Payer: Self-pay | Admitting: Pharmacy Technician

## 2022-01-05 NOTE — Telephone Encounter (Signed)
Patient Advocate Encounter   Received notification from CoverMyMeds that prior authorization for Relion Novolin N is required by his/her insurance RX Advance Prescrip.  No PA needed. Pt received over a 90 day supply on 11/12/21  Spoke with the pharmacy. They will follow up with the pt. She shouldn't be out.

## 2022-01-14 ENCOUNTER — Encounter (INDEPENDENT_AMBULATORY_CARE_PROVIDER_SITE_OTHER): Payer: BC Managed Care – PPO | Admitting: Ophthalmology

## 2022-01-14 DIAGNOSIS — H43813 Vitreous degeneration, bilateral: Secondary | ICD-10-CM | POA: Diagnosis not present

## 2022-01-14 DIAGNOSIS — E113313 Type 2 diabetes mellitus with moderate nonproliferative diabetic retinopathy with macular edema, bilateral: Secondary | ICD-10-CM | POA: Diagnosis not present

## 2022-01-26 ENCOUNTER — Ambulatory Visit
Admission: RE | Admit: 2022-01-26 | Discharge: 2022-01-26 | Disposition: A | Discharge status: Home / Self Care | Payer: BC Managed Care – PPO | Source: Ambulatory Visit | Attending: Orthopedic Surgery | Admitting: Orthopedic Surgery

## 2022-01-26 DIAGNOSIS — Z1231 Encounter for screening mammogram for malignant neoplasm of breast: Secondary | ICD-10-CM

## 2022-02-11 ENCOUNTER — Encounter (INDEPENDENT_AMBULATORY_CARE_PROVIDER_SITE_OTHER): Payer: BC Managed Care – PPO | Admitting: Ophthalmology

## 2022-02-11 DIAGNOSIS — E113313 Type 2 diabetes mellitus with moderate nonproliferative diabetic retinopathy with macular edema, bilateral: Secondary | ICD-10-CM

## 2022-02-11 DIAGNOSIS — H43813 Vitreous degeneration, bilateral: Secondary | ICD-10-CM | POA: Diagnosis not present

## 2022-02-22 ENCOUNTER — Other Ambulatory Visit: Payer: Self-pay

## 2022-02-22 DIAGNOSIS — E1110 Type 2 diabetes mellitus with ketoacidosis without coma: Secondary | ICD-10-CM

## 2022-02-22 MED ORDER — DEXCOM G6 TRANSMITTER MISC: 0 refills | Status: DC

## 2022-03-11 ENCOUNTER — Encounter (INDEPENDENT_AMBULATORY_CARE_PROVIDER_SITE_OTHER): Payer: BC Managed Care – PPO | Admitting: Ophthalmology

## 2022-03-11 DIAGNOSIS — E113313 Type 2 diabetes mellitus with moderate nonproliferative diabetic retinopathy with macular edema, bilateral: Secondary | ICD-10-CM | POA: Diagnosis not present

## 2022-03-11 DIAGNOSIS — H43813 Vitreous degeneration, bilateral: Secondary | ICD-10-CM

## 2022-04-08 ENCOUNTER — Encounter (INDEPENDENT_AMBULATORY_CARE_PROVIDER_SITE_OTHER): Payer: BC Managed Care – PPO | Admitting: Ophthalmology

## 2022-04-08 DIAGNOSIS — E113313 Type 2 diabetes mellitus with moderate nonproliferative diabetic retinopathy with macular edema, bilateral: Secondary | ICD-10-CM | POA: Diagnosis not present

## 2022-04-08 DIAGNOSIS — H43813 Vitreous degeneration, bilateral: Secondary | ICD-10-CM | POA: Diagnosis not present

## 2022-04-12 ENCOUNTER — Telehealth: Payer: Self-pay

## 2022-04-12 MED ORDER — DEXCOM G6 SENSOR MISC
1.0000 | 1 refills | Status: DC | PRN
Start: 2022-04-12 — End: 2022-09-20

## 2022-04-12 MED ORDER — NOVOLIN R FLEXPEN 100 UNIT/ML IJ SOPN: 18.0000 [IU] | PEN_INJECTOR | Freq: Every day | INTRAMUSCULAR | 5 refills | Status: DC

## 2022-04-12 NOTE — Telephone Encounter (Signed)
Patient request refill

## 2022-05-06 ENCOUNTER — Encounter (INDEPENDENT_AMBULATORY_CARE_PROVIDER_SITE_OTHER): Payer: BC Managed Care – PPO | Admitting: Ophthalmology

## 2022-05-06 DIAGNOSIS — H43813 Vitreous degeneration, bilateral: Secondary | ICD-10-CM

## 2022-05-06 DIAGNOSIS — E113313 Type 2 diabetes mellitus with moderate nonproliferative diabetic retinopathy with macular edema, bilateral: Secondary | ICD-10-CM | POA: Diagnosis not present

## 2022-05-13 ENCOUNTER — Ambulatory Visit (INDEPENDENT_AMBULATORY_CARE_PROVIDER_SITE_OTHER): Payer: BC Managed Care – PPO | Admitting: Orthopedic Surgery

## 2022-05-13 ENCOUNTER — Other Ambulatory Visit: Payer: Self-pay

## 2022-05-13 ENCOUNTER — Encounter: Payer: Self-pay | Admitting: Orthopedic Surgery

## 2022-05-13 VITALS — BP 132/68 | HR 89 | Temp 98.0°F | Resp 17 | Ht 62.0 in | Wt 136.0 lb

## 2022-05-13 DIAGNOSIS — E1169 Type 2 diabetes mellitus with other specified complication: Secondary | ICD-10-CM | POA: Diagnosis not present

## 2022-05-13 DIAGNOSIS — Z23 Encounter for immunization: Secondary | ICD-10-CM

## 2022-05-13 DIAGNOSIS — Z794 Long term (current) use of insulin: Secondary | ICD-10-CM

## 2022-05-13 DIAGNOSIS — E785 Hyperlipidemia, unspecified: Secondary | ICD-10-CM

## 2022-05-13 DIAGNOSIS — R7989 Other specified abnormal findings of blood chemistry: Secondary | ICD-10-CM | POA: Diagnosis not present

## 2022-05-13 DIAGNOSIS — E1110 Type 2 diabetes mellitus with ketoacidosis without coma: Secondary | ICD-10-CM | POA: Diagnosis not present

## 2022-05-13 MED ORDER — NOVOLIN N FLEXPEN RELION 100 UNIT/ML ~~LOC~~ SUPN: 12.0000 [IU] | PEN_INJECTOR | Freq: Every day | SUBCUTANEOUS | 11 refills | Status: DC

## 2022-05-13 MED ORDER — NOVOLIN R FLEXPEN 100 UNIT/ML IJ SOPN: 12.0000 [IU] | PEN_INJECTOR | Freq: Every day | INTRAMUSCULAR | 5 refills | Status: DC

## 2022-05-13 NOTE — Patient Instructions (Addendum)
Reduce Novolin to 12 units daily. Rule of 2- may reduce insulin by 2 units weekly   Please check blood sugars 3x/day  Schedule with endocrinology- Dr. Kelton Pillar- [phone]  Ok to eat portioned controlled carbs - try to eat more at dinner so sugars do not drop  Keep sugar packets in purse- with you at all times

## 2022-05-13 NOTE — Progress Notes (Signed)
Careteam: Patient Care Team: Alicia Alanis, NP as PCP - General (Adult Health Nurse Practitioner) Renato Shin, MD (Inactive) as Consulting Physician (Endocrinology)  Seen by: Windell Moulding, AGNP-C  PLACE OF SERVICE:  [place] Directive information Does Patient Have a Medical Advance Directive?: No, Would patient like information on creating a medical advance directive?: Yes (Inpatient - patient defers creating a medical advance directive at this time - Information given)  No Known Allergies  Chief Complaint  Patient presents with   Medical Management of Chronic Issues    6 month follow up and foot exam   Immunizations    Discussed the need for flu and patient declined and shingles. [facility] verified   Health Maintenance    Discuss the need for PAP and A1C     HPI: Patient is a 66 y.o. female seen today for medical management of chronic conditions.   Daughter present during encounter.   She had her teeth removed 01/2022 for dentures. Since procedure she has been having trouble eating. She has lost 8 lbs since last encounter. Recently been having hypoglycemic events in evening, sugars 40-60's. She is taking novolin every morning. She reduced her Novolin to 14 units daily. Blood sugar 63 in office today. She uses a Dexcom to monitor sugars. Daughter reports she does not check Dexcom throughout day, and only pays attention when Dexcom alarms due to low blood sugar. She believes low blood sugars are due eating issues related to teeth removal. She was followed by Dr. Loanne Drilling but has not established with new provider since he left. Treatment options discussed with patient. She plans to schedule with endocrinology.   She continues to have her eyes checked monthly by Dr. Zigmund Daniel due to diabetic retinopathy and macular degeneration.   Refused flu vaccine today.   01/26/2022 mammogram negative for malignancy, repeat study in 1 year.    Review of Systems:  Review of Systems   Constitutional:  Positive for weight loss. Negative for chills, fever and malaise/fatigue.  HENT:  Negative for hearing loss and sore throat.        Dental pain  Eyes:  Negative for blurred vision and double vision.  Respiratory:  Negative for cough, shortness of breath and wheezing.   Cardiovascular:  Negative for chest pain and leg swelling.  Gastrointestinal:  Negative for abdominal pain, blood in stool, constipation, diarrhea, heartburn, nausea and vomiting.  Genitourinary:  Negative for dysuria and hematuria.  Musculoskeletal:  Negative for falls and joint pain.  Skin:  Negative for rash.  Neurological:  Negative for dizziness, weakness and headaches.  Endo/Heme/Allergies:  Negative for polydipsia.  Psychiatric/Behavioral:  Negative for depression and memory loss. The patient is not nervous/anxious and does not have insomnia.     Past Medical History:  Diagnosis Date   Diabetes mellitus without complication ([place])    Type 2    Past Surgical History:  Procedure Laterality Date   DIAGNOSTIC MAMMOGRAM  2022   Per new patient packet   Section   Per new patient packet   Social History:   reports that she has never smoked. She has never used smokeless tobacco. She reports that she does not drink alcohol and does not use drugs.  Family History  Problem Relation Age of Onset   Breast cancer Mother 56   Diabetes Father    Cancer Maternal Grandmother    Dementia Maternal Grandmother     Medications: Patient's Medications  New Prescriptions  No medications on file  Previous Medications   CONTINUOUS BLOOD GLUC SENSOR (DEXCOM G6 SENSOR) MISC    1 Application by Does not apply route as needed. Apply one sensor every 10 days. DX:E11.10   CONTINUOUS BLOOD GLUC TRANSMIT (DEXCOM G6 TRANSMITTER) MISC    USE AS DIRECTED   INSULIN NPH, HUMAN,, ISOPHANE, (NOVOLIN N FLEXPEN RELION) 100 UNIT/ML KIWKPEN    Inject 16 Units into the skin daily with breakfast.    INSULIN REGULAR HUMAN (NOVOLIN R FLEXPEN RELION) 100 UNIT/ML KWIKPEN    Inject 18 Units as directed daily with breakfast.   ROSUVASTATIN (CRESTOR) 20 MG TABLET    Take 1 tablet (20 mg total) by mouth daily.  Modified Medications   No medications on file  Discontinued Medications   No medications on file    Physical Exam:  Vitals:   05/13/22 0928  BP: 132/68  Pulse: 89  Resp: 17  Temp: 98 F (36.7 C)  TempSrc: Temporal  SpO2: 99%  Weight: 136 lb (61.7 kg)  Height: 5' 2"  (1.575 m)   Body mass index is 24.87 kg/m. Wt Readings from Last 3 Encounters:  05/13/22 136 lb (61.7 kg)  11/26/21 148 lb 9.6 oz (67.4 kg)  11/12/21 150 lb 3.2 oz (68.1 kg)    Physical Exam Vitals reviewed.  Constitutional:      General: She is not in acute distress. HENT:     Head: Normocephalic.  Eyes:     General:        Right eye: No discharge.        Left eye: No discharge.  Cardiovascular:     Rate and Rhythm: Normal rate and regular rhythm.     Pulses: Normal pulses.          Dorsalis pedis pulses are 2+ on the right side and 2+ on the left side.       Posterior tibial pulses are 2+ on the right side and 2+ on the left side.     Heart sounds: Normal heart sounds.  Pulmonary:     Effort: Pulmonary effort is normal. No respiratory distress.     Breath sounds: Normal breath sounds. No wheezing.  Abdominal:     General: Bowel sounds are normal. There is no distension.     Palpations: Abdomen is soft.     Tenderness: There is no abdominal tenderness.  Musculoskeletal:     Cervical back: Neck supple.     Right lower leg: No edema.     Left lower leg: No edema.  Feet:     Right foot:     Protective Sensation: 10 sites tested.  10 sites sensed.     Skin integrity: Skin integrity normal.     Toenail Condition: Right toenails are normal.     Left foot:     Protective Sensation: 10 sites tested.  10 sites sensed.     Skin integrity: Skin integrity normal.     Toenail Condition: Left  toenails are normal.  Skin:    General: Skin is warm and dry.     Capillary Refill: Capillary refill takes less than 2 seconds.  Neurological:     General: No focal deficit present.     Mental Status: She is alert and oriented to person, place, and time.     Motor: No weakness.     Gait: Gait normal.  Psychiatric:        Mood and Affect: Mood normal.  Behavior: Behavior normal.     Labs reviewed: Basic Metabolic Panel: Recent Labs    11/12/21 1132  NA 140  K 4.8  CL 106  CO2 28  GLUCOSE 144*  BUN 26*  CREATININE 0.82  CALCIUM 9.9  TSH 4.55*   Liver Function Tests: Recent Labs    11/12/21 1132  AST 14  ALT 13  BILITOT 0.3  PROT 7.3   No results for input(s): "LIPASE", "AMYLASE" in the last 8760 hours. No results for input(s): "AMMONIA" in the last 8760 hours. CBC: Recent Labs    11/12/21 1132  WBC 7.2  NEUTROABS 5,191  HGB 11.2*  HCT 33.3*  MCV 94.3  PLT 338   Lipid Panel: Recent Labs    11/12/21 1132  CHOL 246*  HDL 76  LDLCALC 155*  TRIG 60  CHOLHDL 3.2   TSH: Recent Labs    11/12/21 1132  TSH 4.55*   A1C: Lab Results  Component Value Date   HGBA1C 7.6 (A) 09/23/2021     Assessment/Plan 1. Type 2 diabetes mellitus with ketoacidosis without coma, with long-term current use of insulin (HCC) - A1c 7.6 09/23/2021 - hypoglycemic events in evening - Reduce novolin to 12 units, advised to reduce by another 2 units until sugars > 70 < 250 - advised to schedule with endocrinology - advised to check Dexcom meter 3x/day  - advised to eat more in evening - contact PCP is hypoglycemic events continue to occur - CBC with Differential/Platelet - CMP with eGFR([facility]) - Hemoglobin A1c - Insulin NPH, Human,, Isophane, (NOVOLIN N FLEXPEN RELION) 100 UNIT/ML Kiwkpen; Inject 12 Units into the skin daily with breakfast.  Dispense: 15 mL; Refill: 11 - Insulin Regular Human (NOVOLIN R FLEXPEN RELION) 100 UNIT/ML KwikPen; Inject 12 Units as  directed daily with breakfast.  Dispense: 15 mL; Refill: 5  2. Abnormal thyroid stimulating hormone (TSH) level - TSH 4.55 11/12/2021 - TSH - T4, Free - T3, Free  3. Hyperlipidemia associated with type 2 diabetes mellitus (HCC) - LDL 155 11/12/2021, goal < 70 - not fasting today - cont Crestor - Lipid Panel  Total time: 34 minutes. Greater than 50% of total time spent doing patient education regarding T2DM, hypoglycemia, and health maintenance.     Next appt: Visit date not found  [place] [phone]

## 2022-05-14 LAB — TSH: TSH: 3.96 mIU/L (ref 0.40–4.50)

## 2022-05-14 LAB — CBC WITH DIFFERENTIAL/PLATELET
Absolute Monocytes: 562 cells/uL (ref 200–950)
Basophils Absolute: 73 cells/uL (ref 0–200)
Basophils Relative: 1.4 %
Eosinophils Absolute: 172 cells/uL (ref 15–500)
Eosinophils Relative: 3.3 %
HCT: 34.5 % — ABNORMAL LOW (ref 35.0–45.0)
Hemoglobin: 11.6 g/dL — ABNORMAL LOW (ref 11.7–15.5)
Lymphs Abs: 1596 cells/uL (ref 850–3900)
MCH: 31.2 pg (ref 27.0–33.0)
MCHC: 33.6 g/dL (ref 32.0–36.0)
MCV: 92.7 fL (ref 80.0–100.0)
MPV: 9.7 fL (ref 7.5–12.5)
Monocytes Relative: 10.8 %
Neutro Abs: 2798 cells/uL (ref 1500–7800)
Neutrophils Relative %: 53.8 %
Platelets: 336 10*3/uL (ref 140–400)
RBC: 3.72 10*6/uL — ABNORMAL LOW (ref 3.80–5.10)
RDW: 13.1 % (ref 11.0–15.0)
Total Lymphocyte: 30.7 %
WBC: 5.2 10*3/uL (ref 3.8–10.8)

## 2022-05-14 LAB — LIPID PANEL
Cholesterol: 213 mg/dL — ABNORMAL HIGH (ref <200)
HDL: 87 mg/dL (ref >=50)
LDL Cholesterol (Calc): 112 mg/dL (calc) — ABNORMAL HIGH
Non-HDL Cholesterol (Calc): 126 mg/dL (calc) (ref <130)
Total CHOL/HDL Ratio: 2.4 (calc) (ref <5.0)
Triglycerides: 49 mg/dL (ref <150)

## 2022-05-14 LAB — COMPLETE METABOLIC PANEL WITH GFR
AG Ratio: 1.4 (calc) (ref 1.0–2.5)
ALT: 6 U/L (ref 6–29)
AST: 12 U/L (ref 10–35)
Albumin: 4.2 g/dL (ref 3.6–5.1)
Alkaline phosphatase (APISO): 79 U/L (ref 37–153)
BUN: 19 mg/dL (ref 7–25)
CO2: 27 mmol/L (ref 20–32)
Calcium: 9.6 mg/dL (ref 8.6–10.4)
Chloride: 108 mmol/L (ref 98–110)
Creat: 0.73 mg/dL (ref 0.50–1.05)
Globulin: 3 g/dL (calc) (ref 1.9–3.7)
Glucose, Bld: 22 mg/dL — CL (ref 65–99)
Potassium: 4.5 mmol/L (ref 3.5–5.3)
Sodium: 142 mmol/L (ref 135–146)
Total Bilirubin: 0.3 mg/dL (ref 0.2–1.2)
Total Protein: 7.2 g/dL (ref 6.1–8.1)
eGFR: 92 mL/min/{1.73_m2} (ref >=60)

## 2022-05-14 LAB — HEMOGLOBIN A1C
Hgb A1c MFr Bld: 6.7 % of total Hgb — ABNORMAL HIGH (ref <5.7)
Mean Plasma Glucose: 146 mg/dL
eAG (mmol/L): 8.1 mmol/L

## 2022-05-14 LAB — T4, FREE: Free T4: 0.9 ng/dL (ref 0.8–1.8)

## 2022-05-14 LAB — T3, FREE: T3, Free: 2.9 pg/mL (ref 2.3–4.2)

## 2022-06-03 ENCOUNTER — Encounter (INDEPENDENT_AMBULATORY_CARE_PROVIDER_SITE_OTHER): Payer: BC Managed Care – PPO | Admitting: Ophthalmology

## 2022-06-03 DIAGNOSIS — H43813 Vitreous degeneration, bilateral: Secondary | ICD-10-CM

## 2022-06-03 DIAGNOSIS — E113313 Type 2 diabetes mellitus with moderate nonproliferative diabetic retinopathy with macular edema, bilateral: Secondary | ICD-10-CM | POA: Diagnosis not present

## 2022-06-24 ENCOUNTER — Other Ambulatory Visit: Payer: Self-pay | Admitting: Internal Medicine

## 2022-06-24 DIAGNOSIS — E1110 Type 2 diabetes mellitus with ketoacidosis without coma: Secondary | ICD-10-CM

## 2022-06-25 ENCOUNTER — Other Ambulatory Visit: Payer: Self-pay

## 2022-06-25 ENCOUNTER — Other Ambulatory Visit: Payer: Self-pay | Admitting: Internal Medicine

## 2022-06-25 DIAGNOSIS — E1110 Type 2 diabetes mellitus with ketoacidosis without coma: Secondary | ICD-10-CM

## 2022-06-25 MED ORDER — DEXCOM G6 TRANSMITTER MISC: 3 refills | Status: DC

## 2022-06-28 ENCOUNTER — Telehealth: Payer: Self-pay

## 2022-06-28 NOTE — Telephone Encounter (Addendum)
Patient called and stated that she was told to schedule appointment with Dr. Antonieta Iba at [facility]. She called and they told her she needs a referral. I called [facility] and since patient was just seen earlier this year by Dr. Everardo All she did not need a referral. She can just call to schedule her own appointment.  I called patient back and left detailed message with this information on her voicemail.

## 2022-07-02 ENCOUNTER — Encounter (INDEPENDENT_AMBULATORY_CARE_PROVIDER_SITE_OTHER): Payer: BC Managed Care – PPO | Admitting: Ophthalmology

## 2022-07-05 ENCOUNTER — Encounter (INDEPENDENT_AMBULATORY_CARE_PROVIDER_SITE_OTHER): Payer: BC Managed Care – PPO | Admitting: Ophthalmology

## 2022-07-05 ENCOUNTER — Encounter: Payer: Self-pay | Admitting: Internal Medicine

## 2022-07-05 ENCOUNTER — Ambulatory Visit (INDEPENDENT_AMBULATORY_CARE_PROVIDER_SITE_OTHER): Payer: BC Managed Care – PPO | Admitting: Internal Medicine

## 2022-07-05 VITALS — BP 150/74 | HR 84 | Ht 62.0 in | Wt 139.4 lb

## 2022-07-05 DIAGNOSIS — H43813 Vitreous degeneration, bilateral: Secondary | ICD-10-CM

## 2022-07-05 DIAGNOSIS — E10319 Type 1 diabetes mellitus with unspecified diabetic retinopathy without macular edema: Secondary | ICD-10-CM | POA: Diagnosis not present

## 2022-07-05 DIAGNOSIS — E113313 Type 2 diabetes mellitus with moderate nonproliferative diabetic retinopathy with macular edema, bilateral: Secondary | ICD-10-CM | POA: Diagnosis not present

## 2022-07-05 LAB — POCT GLYCOSYLATED HEMOGLOBIN (HGB A1C): Hemoglobin A1C: 6.6 % — AB (ref 4.0–5.6)

## 2022-07-05 MED ORDER — INSULIN PEN NEEDLE 32G X 4 MM MISC: 1.0000 | Freq: Four times a day (QID) | 3 refills | Status: DC

## 2022-07-05 MED ORDER — NOVOLOG FLEXPEN 100 UNIT/ML ~~LOC~~ SOPN: 6.0000 [IU] | PEN_INJECTOR | Freq: Three times a day (TID) | SUBCUTANEOUS | 6 refills | Status: DC

## 2022-07-05 MED ORDER — LANTUS SOLOSTAR 100 UNIT/ML ~~LOC~~ SOPN: 10.0000 [IU] | PEN_INJECTOR | Freq: Every day | SUBCUTANEOUS | 3 refills | Status: DC

## 2022-07-05 NOTE — Progress Notes (Signed)
Name: Alicia Andrews  Age/ Sex: 66 y.o., female   MRN/ DOB: [id number], 1955/09/26     PCP: Octavia Heir, NP   Reason for Endocrinology Evaluation: Type 1 Diabetes Mellitus  Initial Endocrine Consultative Visit: 04/11/2020    PATIENT IDENTIFIER: Alicia Andrews is a 66 y.o. female with a past medical history of DM and dyslipidemia. The patient has followed with Endocrinology clinic since 04/11/2020 for consultative assistance with management of her diabetes.  DIABETIC HISTORY:  Ms. Veloso was diagnosed with DM 1993.  Patient was initially diagnosed as type 2 DM , but her diagnosis was changed to type1 DM by her previous endocrinologist after a DKA admission in 01/2019.  Insulin started in 2020. Her hemoglobin A1c has ranged from 6.7% in 2023, peaking at 130% in 2020.  Patient was followed by Dr. Everardo All from 2021 until March 2023    SUBJECTIVE:   During the last visit (11/12/2021): Saw Dr. Everardo All  Today (07/05/2022): Ms. Enlow is here for follow-up on diabetes management. She is accompanied by her daughter Huntley Dec.  She checks her blood sugars multiple  times daily though CGM. The patient has  had hypoglycemic episodes since the last clinic visit, which typically occur daily, patient is symptomatic with these episodes.  She eats 2 -3 meals a day   She takes both insulin before Brunch , has to eat by 6 pm otherwise she will have hypoglycemic episode  Denies nausea, vomiting or diarrhea   HOME DIABETES REGIMEN:  Novolin- NPH  12 units QAM Novolin-R 12 units QAM     Statin: Yes ACE-I/ARB: No   CONTINUOUS GLUCOSE MONITORING RECORD INTERPRETATION    Dates of Recording: 11/7-//09/04/2021  Sensor description:dexcom  Results statistics:   CGM use % of time 93  Average and SD 182/66  Time in range    46    %  % Time Above 180 34  % Time above 250 16  % Time Below target 2   Glycemic patterns summary: Bg's high overnight and trends down during the day    Hyperglycemic episodes  postprandial  Hypoglycemic episodes occurred in the afternoon  Overnight periods: variable    DIABETIC COMPLICATIONS: Microvascular complications:  DR B/L on injections,  Denies: neuropathy Last Eye Exam: Completed 2023  Macrovascular complications:   Denies: CAD, CVA, PVD   HISTORY:  Past Medical History:  Past Medical History:  Diagnosis Date   Diabetes mellitus without complication (HCC)    Type 2    Past Surgical History:  Past Surgical History:  Procedure Laterality Date   DIAGNOSTIC MAMMOGRAM  2022   Per new patient packet   TONSILLECTOMY AND ADENOIDECTOMY  1963   Per new patient packet   Social History:  reports that she has never smoked. She has never used smokeless tobacco. She reports that she does not drink alcohol and does not use drugs. Family History:  Family History  Problem Relation Age of Onset   Breast cancer Mother 21   Diabetes Father    Cancer Maternal Grandmother    Dementia Maternal Grandmother      HOME MEDICATIONS: Allergies as of 07/05/2022   No Known Allergies      Medication List        Accurate as of July 05, 2022  2:30 PM. If you have any questions, ask your nurse or doctor.          STOP taking these medications    NovoLIN N FlexPen ReliOn 100 UNIT/ML  Kiwkpen Generic drug: Insulin NPH (Human) (Isophane) Stopped by: Scarlette Shorts, MD   NovoLIN R FlexPen ReliOn 100 UNIT/ML KwikPen Generic drug: Insulin Regular Human Stopped by: Scarlette Shorts, MD       TAKE these medications    Dexcom G6 Sensor Misc 1 Application by Does not apply route as needed. Apply one sensor every 10 days. DX:E11.10   Dexcom G6 Transmitter Misc USE AS DIRECTED   Insulin Pen Needle 32G X 4 MM Misc 1 Device by Does not apply route in the morning, at noon, in the evening, and at bedtime. Started by: Scarlette Shorts, MD   Lantus SoloStar 100 UNIT/ML Solostar Pen Generic drug: insulin  glargine Inject 10 Units into the skin daily. Started by: Scarlette Shorts, MD   NovoLOG FlexPen 100 UNIT/ML FlexPen Generic drug: insulin aspart Inject 6 Units into the skin 3 (three) times daily with meals. Started by: Scarlette Shorts, MD   rosuvastatin 20 MG tablet Commonly known as: Crestor Take 1 tablet (20 mg total) by mouth daily.         OBJECTIVE:   Vital Signs: BP (!) 150/74 (BP Location: Right Arm, Patient Position: Sitting, Cuff Size: Normal)   Pulse 84   Ht 5\' 2"  (1.575 m)   Wt 139 lb 6.4 oz (63.2 kg)   SpO2 99%   BMI 25.50 kg/m   Wt Readings from Last 3 Encounters:  07/05/22 139 lb 6.4 oz (63.2 kg)  05/13/22 136 lb (61.7 kg)  11/26/21 148 lb 9.6 oz (67.4 kg)     Exam: General: Pt appears well and is in NAD  Neck: General: Supple without adenopathy. Thyroid: Thyroid size normal.  No goiter or nodules appreciated.   Lungs: Clear with good BS bilat   Heart: RRR   Abdomen:  soft, nontender  Extremities: No pretibial edema.   Neuro: MS is good with appropriate affect, pt is alert and Ox3         DATA REVIEWED:  Lab Results  Component Value Date   HGBA1C 6.6 (A) 07/05/2022   HGBA1C 6.7 (H) 05/13/2022   HGBA1C 7.6 (A) 09/23/2021   Lab Results  Component Value Date   MICROALBUR 1.3 11/12/2021   LDLCALC 112 (H) 05/13/2022   CREATININE 0.73 05/13/2022   Lab Results  Component Value Date   MICRALBCREAT 8 11/12/2021     Lab Results  Component Value Date   CHOL 213 (H) 05/13/2022   HDL 87 05/13/2022   LDLCALC 112 (H) 05/13/2022   TRIG 49 05/13/2022   CHOLHDL 2.4 05/13/2022         ASSESSMENT / PLAN / RECOMMENDATIONS:   1) Type 1 Diabetes Mellitus, Poorly controlled, With retinopathic complications - Most recent A1c of 6.6 %. Goal A1c < 7.0 %.    -Despite her seemingly optimal A1c of 6.6%, the patient has recurrent hypoglycemic episodes, she had a serum glucose of 22 mg/DL in September!!! -Discussed pharmacokinetics of  basal/bolus insulin and the importance of taking prandial insulin with meals.  -I have recommended switching NPH/regular insulin to insulin analogs due to less risk of hypoglycemia and more predictability and pharmacokinetics. -I will refer her to our CDE as she has no diabetes education and does not know what carbohydrates are -Today we briefly discussed what carbohydrates are and the importance of limiting intake -Prescriptions for Lantus/NovoLog and pen needles have been sent to the pharmacy   MEDICATIONS: Stop Novolin-N Stop Novolin-R Start Lantus 10 units daily Start NovoLog  6 units 3 times daily before every meal Start correction factor : NovoLog (BG -130/50)  EDUCATION / INSTRUCTIONS: BG monitoring instructions: Patient is instructed to check her blood sugars 3 times a day, before meals . Call [facility] if: BG persistently < 70  I reviewed the Rule of 15 for the treatment of hypoglycemia in detail with the patient. Literature supplied.    2) Diabetic complications:  Eye: Does  have known diabetic retinopathy.  Neuro/ Feet: Does not have known diabetic peripheral neuropathy .  Renal: Patient does not have known baseline CKD. She   is not on an ACEI/ARB at present.      F/U in 4 months   I spent 25 minutes preparing to see the patient by review of recent labs, imaging and procedures, obtaining and reviewing separately obtained history, communicating with the patient/family or caregiver, ordering medications, tests or procedures, and documenting clinical information in the EHR including the differential Dx, treatment, and any further evaluation and other management    Signed electronically by: Lyndle Herrlich, MD  [facility]  [facility] [address] [place], [place] [address] Phone: [phone] FAX: [phone]   CC: Octavia Heir, NP [address] [place] [address] Phone: [phone]  Fax:  [phone]  Return to Endocrinology clinic as below: Future Appointments  Date Time Provider Department Center  11/09/2022  9:30 AM Irma Delancey, Konrad Dolores, MD [facility] None  11/11/2022  9:20 AM Octavia Heir, NP [facility] None

## 2022-07-05 NOTE — Patient Instructions (Signed)
Stop Novolin-N  Stop Novolin- R    Start Lantus 10 units ONCE daily  Start Novolog 6 units with each meal  Novolog correctional insulin: ADD extra units on insulin to your meal-time Novolog dose if your blood sugars are higher than 180. Use the scale below to help guide you:   Blood sugar before meal Number of units to inject  Less than 180 0 unit  181 -  230 1 units  231 -  280 2 units  281 -  330 3 units  331 -  380 4 units  381 -  430 5 units      HOW TO TREAT LOW BLOOD SUGARS (Blood sugar LESS THAN 70 MG/DL) Please follow the RULE OF 15 for the treatment of hypoglycemia treatment (when your (blood sugars are less than 70 mg/dL)   STEP 1: Take 15 grams of carbohydrates when your blood sugar is low, which includes:  3-4 GLUCOSE TABS  OR 3-4 OZ OF JUICE OR REGULAR SODA OR ONE TUBE OF GLUCOSE GEL    STEP 2: RECHECK blood sugar in 15 MINUTES STEP 3: If your blood sugar is still low at the 15 minute recheck --> then, go back to STEP 1 and treat AGAIN with another 15 grams of carbohydrates.

## 2022-08-02 ENCOUNTER — Encounter (INDEPENDENT_AMBULATORY_CARE_PROVIDER_SITE_OTHER): Payer: BC Managed Care – PPO | Admitting: Ophthalmology

## 2022-08-02 DIAGNOSIS — H43813 Vitreous degeneration, bilateral: Secondary | ICD-10-CM

## 2022-08-02 DIAGNOSIS — E113393 Type 2 diabetes mellitus with moderate nonproliferative diabetic retinopathy without macular edema, bilateral: Secondary | ICD-10-CM | POA: Diagnosis not present

## 2022-08-30 ENCOUNTER — Encounter (INDEPENDENT_AMBULATORY_CARE_PROVIDER_SITE_OTHER): Payer: BC Managed Care – PPO | Admitting: Ophthalmology

## 2022-08-30 DIAGNOSIS — H43813 Vitreous degeneration, bilateral: Secondary | ICD-10-CM

## 2022-08-30 DIAGNOSIS — E113313 Type 2 diabetes mellitus with moderate nonproliferative diabetic retinopathy with macular edema, bilateral: Secondary | ICD-10-CM | POA: Diagnosis not present

## 2022-09-01 ENCOUNTER — Encounter: Payer: BC Managed Care – PPO | Attending: Internal Medicine | Admitting: Nutrition

## 2022-09-01 DIAGNOSIS — E1065 Type 1 diabetes mellitus with hyperglycemia: Secondary | ICD-10-CM | POA: Insufficient documentation

## 2022-09-01 NOTE — Patient Instructions (Signed)
Add 1 extra until of Novolog each carb serving over 3. When having a low blood sugar, verify the low with a fingerstick and if below 70, call office with reading and time of day

## 2022-09-01 NOTE — Progress Notes (Signed)
Patient is here to review her blood sugars and diet. Insulin:  Lantus: 10u at 11:30PM,              Novolog 6u 5-10 minutes before eating.  Says is adding more insulin per Dr. Quin Hoop sliding scale without any difficulty.   She is not however adjusting the dose per meal size and carbohydrates eaten. SBGM:  Dexcom G6:  63% in target range: 1% very low, 6% low, 24% high, and 8% very high.  She has forgotten her Lantus two nights in last 2 weeks.                                   One low this AM of 52 at 7:30, before waking.  Says this is happening twice a month. Diet:  Typical day: 7:30 up  will drink coffee with cream or tea black  no food 11-11:30  usually will eat a [employer] bread and have soup with no crackers or sandwich.  Unsweet drink 6:30-7:30 Supper:  meat-3-4 ounces, 2-3 starches in the form of noodles, pasta, or starchy veg., one non starchy veg., and water or diet soda to drink' 9-10 PM small snack of 15-30 carbs 11;30 bed.  Denies eating between meals or during the night.  Discussion: Need for balanced meals of protein, carbs and small amount of fat at every meal.  Discussed what foods are in each category, and quantities in serving sizes,  and how much she should be eating.   Need to take more insulin if eating more carbs and will add 1u for every additional carb serving she is eating.3 Symptoms and treatment of low blood sugars and importance of confirming a low sensor reading with a finger stick.  She will do this with next low, and call office if blood sugars drop less than 70, especially during the night or early morning. Effects of exercise and stress on blood sugar readings.

## 2022-09-07 ENCOUNTER — Encounter (INDEPENDENT_AMBULATORY_CARE_PROVIDER_SITE_OTHER): Payer: BC Managed Care – PPO | Admitting: Ophthalmology

## 2022-09-07 DIAGNOSIS — H26491 Other secondary cataract, right eye: Secondary | ICD-10-CM

## 2022-09-19 ENCOUNTER — Other Ambulatory Visit: Payer: Self-pay | Admitting: Orthopedic Surgery

## 2022-09-28 ENCOUNTER — Encounter (INDEPENDENT_AMBULATORY_CARE_PROVIDER_SITE_OTHER): Payer: BC Managed Care – PPO | Admitting: Ophthalmology

## 2022-09-28 DIAGNOSIS — H43813 Vitreous degeneration, bilateral: Secondary | ICD-10-CM

## 2022-09-28 DIAGNOSIS — E113313 Type 2 diabetes mellitus with moderate nonproliferative diabetic retinopathy with macular edema, bilateral: Secondary | ICD-10-CM

## 2022-10-12 ENCOUNTER — Other Ambulatory Visit: Payer: Self-pay | Admitting: Nutrition

## 2022-10-26 ENCOUNTER — Encounter (INDEPENDENT_AMBULATORY_CARE_PROVIDER_SITE_OTHER): Payer: BC Managed Care – PPO | Admitting: Ophthalmology

## 2022-10-26 DIAGNOSIS — H43813 Vitreous degeneration, bilateral: Secondary | ICD-10-CM | POA: Diagnosis not present

## 2022-10-26 DIAGNOSIS — E113313 Type 2 diabetes mellitus with moderate nonproliferative diabetic retinopathy with macular edema, bilateral: Secondary | ICD-10-CM

## 2022-11-01 IMAGING — MG MM DIGITAL SCREENING BILAT W/ TOMO AND CAD
8 series · 8 of 24 positions shown · non-contrast
Comparison: Previous exam(s).

CLINICAL DATA: Screening.

EXAM:
DIGITAL SCREENING BILATERAL MAMMOGRAM WITH TOMOSYNTHESIS AND CAD
TECHNIQUE: Bilateral screening digital craniocaudal and mediolateral oblique
mammograms were obtained. Bilateral screening digital breast
tomosynthesis was performed. The images were evaluated with
computer-aided detection.

[R MLO synth-2D]
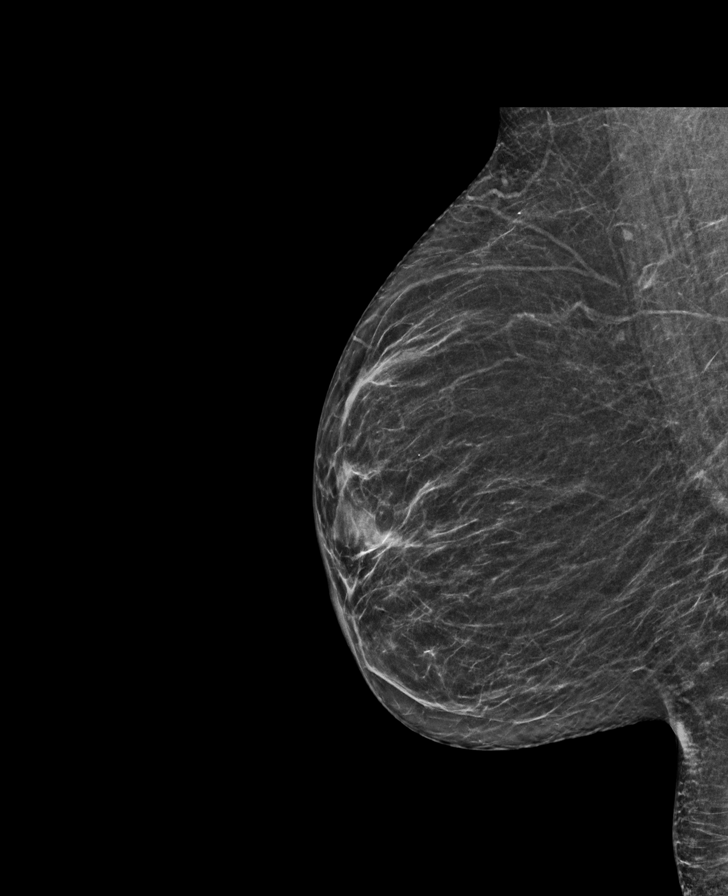

[L CC synth-2D]
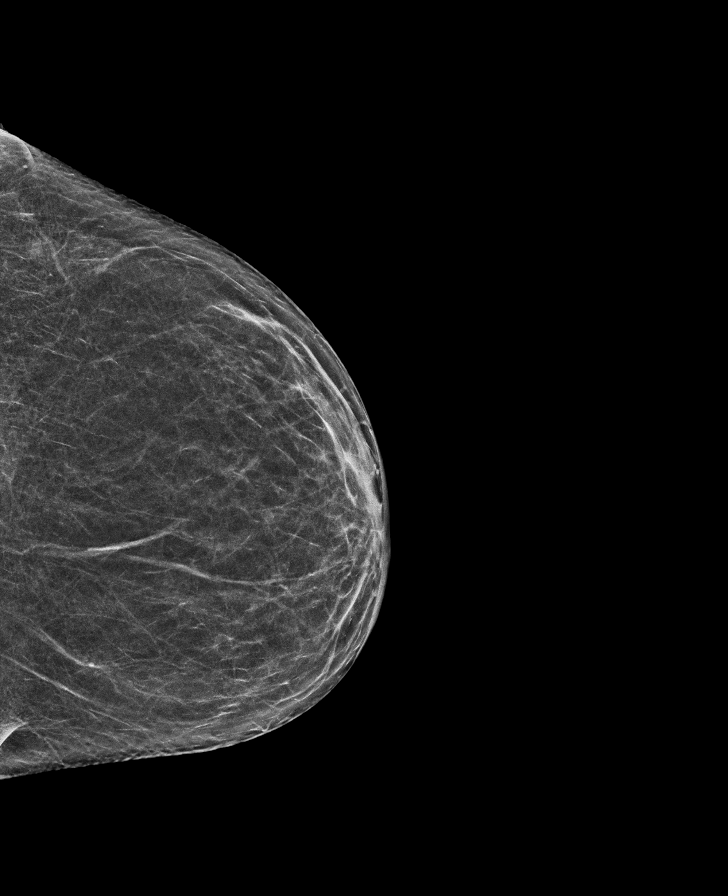

[L MLO synth-2D]
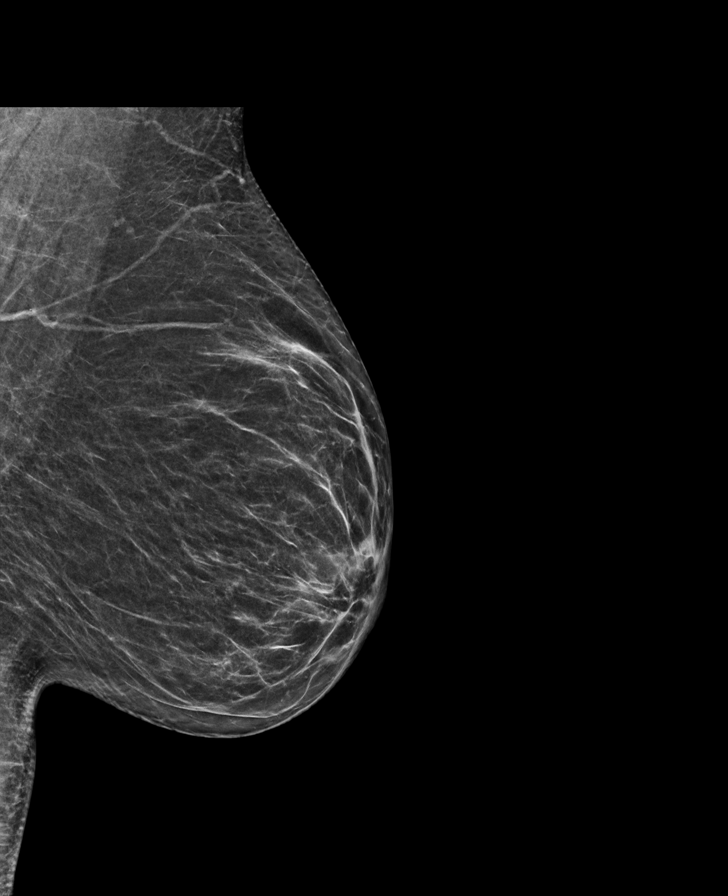

[R CC synth-2D]
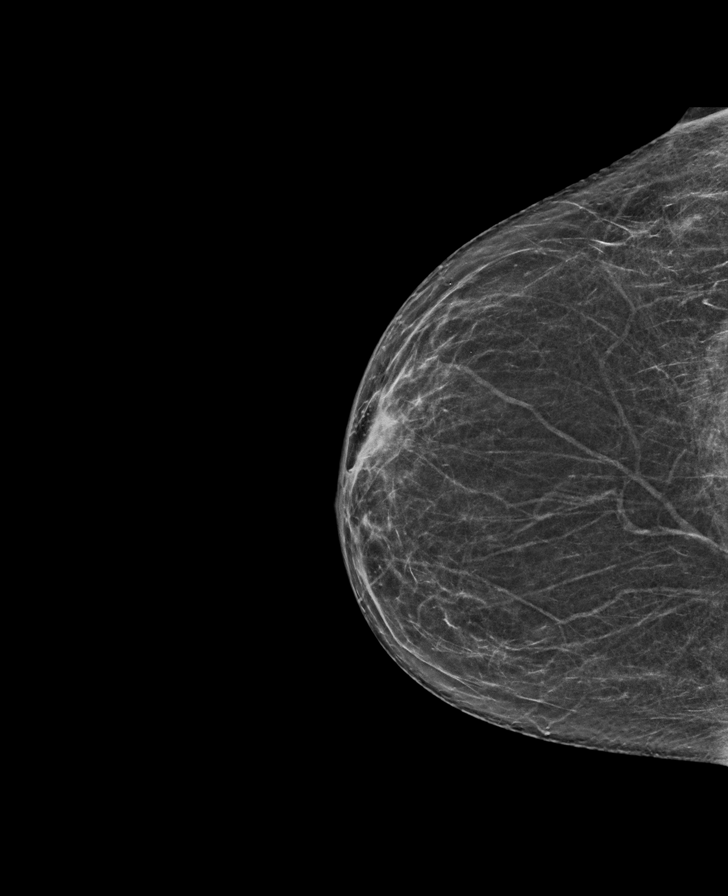

[R CC tomo · tomo slice 31/60.0]
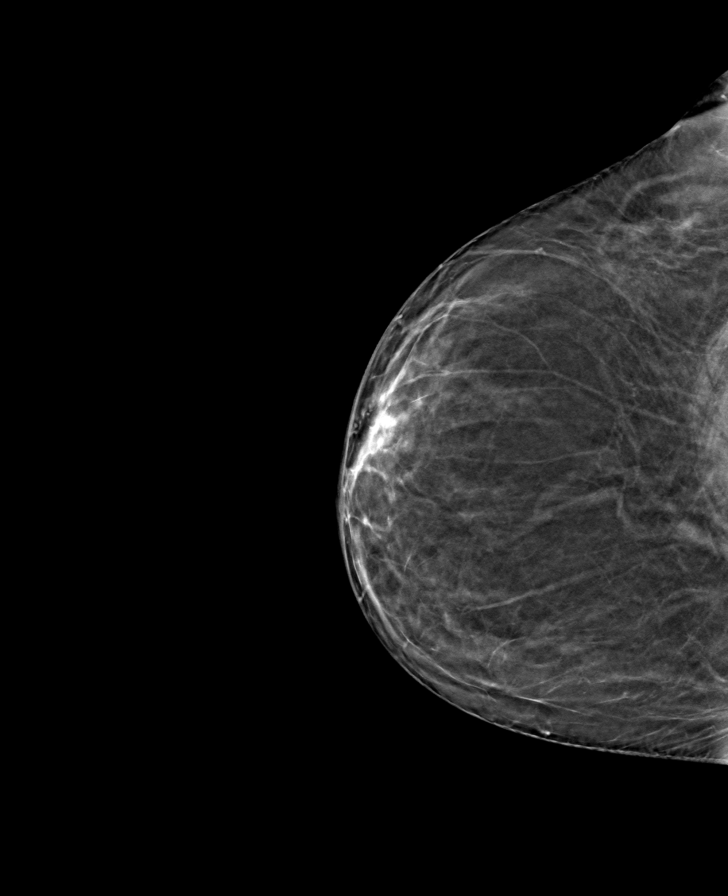

[R MLO tomo · tomo slice 29/58.0]
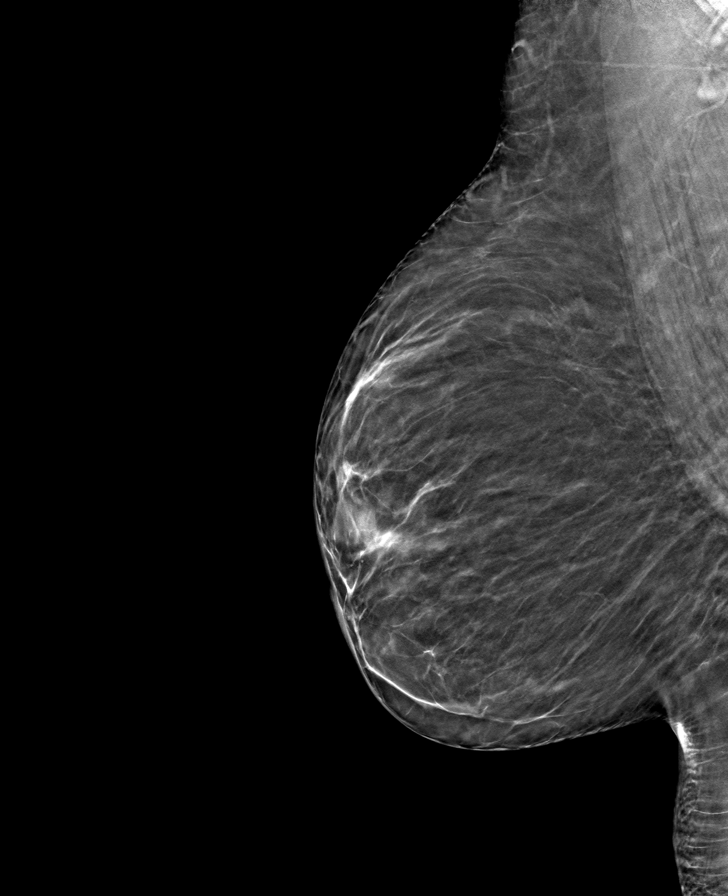

[L CC tomo · tomo slice 29/56.0]
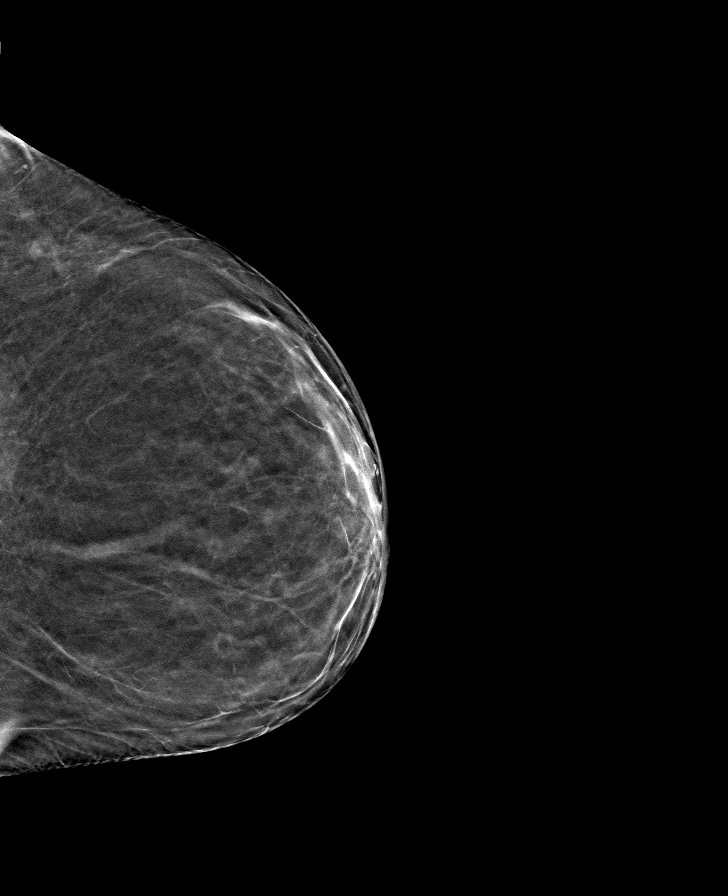

[L MLO tomo · tomo slice 31/62.0]
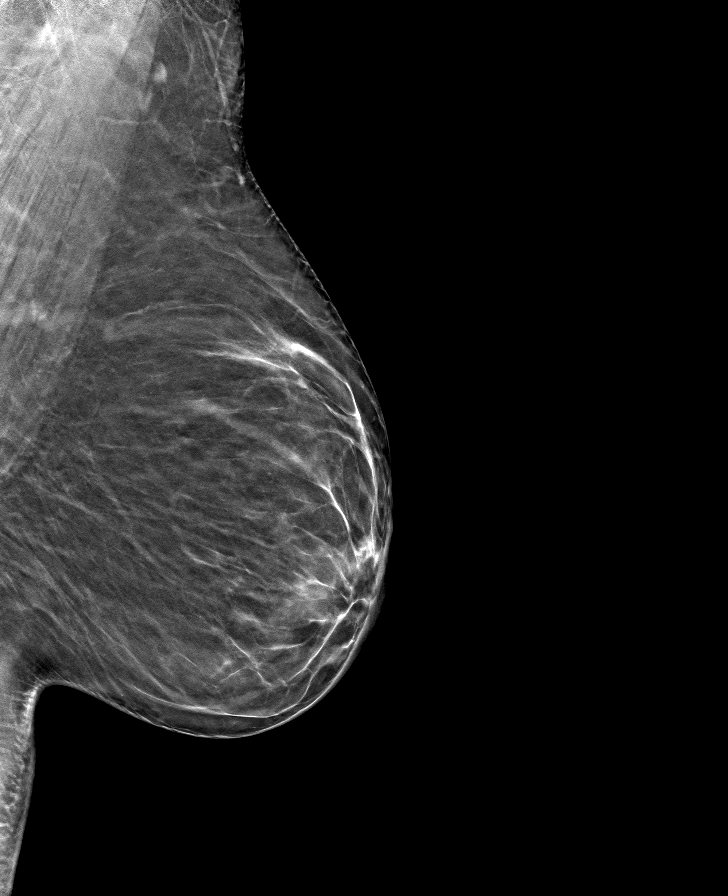

[8 of 24 positions shown; findings below may reference images not displayed]

ACR Breast Density Category b: There are scattered areas of
fibroglandular density.
FINDINGS: There are no findings suspicious for malignancy.
IMPRESSION: No mammographic evidence of malignancy. A result letter of this
screening mammogram will be mailed directly to the patient.

RECOMMENDATION:
Screening mammogram in one year. (Code:51-O-LD2)

BI-RADS CATEGORY  1: Negative.

## 2022-11-02 ENCOUNTER — Ambulatory Visit: Payer: BC Managed Care – PPO | Admitting: Internal Medicine

## 2022-11-04 ENCOUNTER — Encounter (INDEPENDENT_AMBULATORY_CARE_PROVIDER_SITE_OTHER): Payer: BC Managed Care – PPO | Admitting: Ophthalmology

## 2022-11-04 DIAGNOSIS — H26492 Other secondary cataract, left eye: Secondary | ICD-10-CM

## 2022-11-08 NOTE — Progress Notes (Unsigned)
Name: Alicia Andrews  Age/ Sex: 67 y.o., female   MRN/ DOB: [id number], 12-27-1955     PCP: Yvonna Alanis, NP   Reason for Endocrinology Evaluation: Type 1 Diabetes Mellitus  Initial Endocrine Consultative Visit: 04/11/2020    PATIENT IDENTIFIER: Alicia Andrews is a 67 y.o. female with a past medical history of DM and dyslipidemia. The patient has followed with Endocrinology clinic since 04/11/2020 for consultative assistance with management of her diabetes.  DIABETIC HISTORY:  Alicia Andrews was diagnosed with DM 1993.  Patient was initially diagnosed as type 2 DM , but her diagnosis was changed to type1 DM by her previous endocrinologist after a DKA admission in 01/2019.  Insulin started in 2020. Her hemoglobin A1c has ranged from 6.7% in 2023, peaking at 130% in 2020.  Patient was followed by Dr. Loanne Drilling from 2021 until March 2023   On her initial visit with me she had an A1c of 6.6% but the patient was having recurrent hypoglycemic episodes with a serum glucose of 22 mg/DL 04/2022.  I had switched her from human insulin to insulin analogs by November, 2023  SUBJECTIVE:   During the last visit (07/05/2022): A1c 6.6%  Today (11/09/2022): Alicia Andrews is here for follow-up on diabetes management.  She checks her blood sugars multiple  times daily though CGM. The patient has  had hypoglycemic episodes since the last clinic visit, which typically occur daily, patient is symptomatic with these episodes.    Denies nausea, vomiting or diarrhea   HOME DIABETES REGIMEN:  Lantus 10 units daily NovoLog 6 units 3 times daily before every meal Correction factor: NovoLog (BG -130/50)     Statin: Yes ACE-I/ARB: No   CONTINUOUS GLUCOSE MONITORING RECORD INTERPRETATION    Dates of Recording: 3/13-3/26/2024  Sensor description:dexcom  Results statistics:   CGM use % of time 93  Average and SD 143/56  Time in range  72  %  % Time Above 180 18  % Time above 250 6   % Time  Below target 3   Glycemic patterns summary: Bg's trend down at night and increase during the day   Hyperglycemic episodes  postprandial  Hypoglycemic episodes occurred  at night   Overnight periods: trends down     DIABETIC COMPLICATIONS: Microvascular complications:  DR B/L on injections,  Denies: neuropathy Last Eye Exam: Completed 2023  Macrovascular complications:   Denies: CAD, CVA, PVD   HISTORY:  Past Medical History:  Past Medical History:  Diagnosis Date   Diabetes mellitus without complication ([place])    Type 2    Past Surgical History:  Past Surgical History:  Procedure Laterality Date   DIAGNOSTIC MAMMOGRAM  2022   Per new patient packet   [place]   Per new patient packet   Social History:  reports that she has never smoked. She has never used smokeless tobacco. She reports that she does not drink alcohol and does not use drugs. Family History:  Family History  Problem Relation Age of Onset   Breast cancer Mother 28   Diabetes Father    Cancer Maternal Grandmother    Dementia Maternal Grandmother      HOME MEDICATIONS: Allergies as of 11/09/2022   No Known Allergies      Medication List        Accurate as of November 09, 2022 10:32 AM. If you have any questions, ask your nurse or doctor.  Dexcom G6 Sensor Misc USE AS DIRECTED CHANGE EVERY 10 DAYS   Dexcom G6 Transmitter Misc USE AS DIRECTED   Insulin Pen Needle 32G X 4 MM Misc 1 Device by Does not apply route in the morning, at noon, in the evening, and at bedtime.   Lantus SoloStar 100 UNIT/ML Solostar Pen Generic drug: insulin glargine Inject 8 Units into the skin daily. What changed: how much to take Changed by: Dorita Sciara, MD   NovoLOG FlexPen 100 UNIT/ML FlexPen Generic drug: insulin aspart Max daily 30 units What changed:  how much to take how to take this when to take this additional instructions Changed by: Dorita Sciara, MD   rosuvastatin 20 MG tablet Commonly known as: Crestor Take 1 tablet (20 mg total) by mouth daily.         OBJECTIVE:   Vital Signs: BP 120/80 (BP Location: Left Arm, Patient Position: Sitting, Cuff Size: Large)   Pulse 72   Ht 5\' 2"  (1.575 m)   Wt 149 lb (67.6 kg)   SpO2 99%   BMI 27.25 kg/m   Wt Readings from Last 3 Encounters:  11/09/22 149 lb (67.6 kg)  07/05/22 139 lb 6.4 oz (63.2 kg)  05/13/22 136 lb (61.7 kg)     Exam: General: Pt appears well and is in NAD  Lungs: Clear with good BS bilat   Heart: RRR   Extremities: No pretibial edema.   Neuro: MS is good with appropriate affect, pt is alert and Ox3      DM Foot Exam 11/09/2022  The skin of the feet is intact without sores or ulcerations. The pedal pulses are 1+ on right and 1+ on left. The sensation is intact to a screening 5.07, 10 gram monofilament bilaterally    DATA REVIEWED:  Lab Results  Component Value Date   HGBA1C 6.5 (A) 11/09/2022   HGBA1C 6.6 (A) 07/05/2022   HGBA1C 6.7 (H) 05/13/2022   Lab Results  Component Value Date   MICROALBUR 1.3 11/12/2021   LDLCALC 112 (H) 05/13/2022   CREATININE 0.73 05/13/2022   Lab Results  Component Value Date   MICRALBCREAT 8 11/12/2021     Lab Results  Component Value Date   CHOL 213 (H) 05/13/2022   HDL 87 05/13/2022   LDLCALC 112 (H) 05/13/2022   TRIG 49 05/13/2022   CHOLHDL 2.4 05/13/2022         ASSESSMENT / PLAN / RECOMMENDATIONS:   1) Type 1 Diabetes Mellitus, Optimally controlled, With retinopathic complications - Most recent A1c of 6.5 %. Goal A1c < 7.0 %.    - Hypoglycemia have resolved , but she has been noted with tight BG's overnight, I will reduce her basal insulin as below -Due to postprandial hyperglycemia, will increase her prandial insulin as below -She was encouraged to continue to use correction scale before each meal   MEDICATIONS:  Decrease Lantus 8 units daily Increase NovoLog 8 units 3 times  daily before every meal Continue correction factor : NovoLog (BG -130/50)  EDUCATION / INSTRUCTIONS: BG monitoring instructions: Patient is instructed to check her blood sugars 3 times a day, before meals . Call [facility] if: BG persistently < 70  I reviewed the Rule of 15 for the treatment of hypoglycemia in detail with the patient. Literature supplied.    2) Diabetic complications:  Eye: Does  have known diabetic retinopathy.  Neuro/ Feet: Does not have known diabetic peripheral neuropathy .  Renal: Patient does  not have known baseline CKD. She   is not on an ACEI/ARB at present.    3) Dyslipidemia:  -LDL levels above goal June September, 2023 labs -She is already on rosuvastatin 20 mg daily -We will continue to monitor, if this remains elevated, we will consider increasing Crestor  F/U in 6 months      Signed electronically by: Mack Guise, MD  [facility]  [facility] [address], [place] [address] Phone: [phone] FAX: [phone]   CC: Yvonna Alanis, NP [address] [place] [address] Phone: [phone]  Fax: [phone]  Return to Endocrinology clinic as below: Future Appointments  Date Time Provider [place]  11/30/2022 12:50 PM Hayden Pedro, MD [facility] None  05/16/2023  9:10 AM Wiletta Bermingham, Melanie Crazier, MD [facility] None

## 2022-11-09 ENCOUNTER — Ambulatory Visit (INDEPENDENT_AMBULATORY_CARE_PROVIDER_SITE_OTHER): Payer: BC Managed Care – PPO | Admitting: Internal Medicine

## 2022-11-09 ENCOUNTER — Encounter: Payer: Self-pay | Admitting: Internal Medicine

## 2022-11-09 VITALS — BP 120/80 | HR 72 | Ht 62.0 in | Wt 149.0 lb

## 2022-11-09 DIAGNOSIS — E10319 Type 1 diabetes mellitus with unspecified diabetic retinopathy without macular edema: Secondary | ICD-10-CM | POA: Diagnosis not present

## 2022-11-09 DIAGNOSIS — E785 Hyperlipidemia, unspecified: Secondary | ICD-10-CM

## 2022-11-09 LAB — POCT GLYCOSYLATED HEMOGLOBIN (HGB A1C): Hemoglobin A1C: 6.5 % — AB (ref 4.0–5.6)

## 2022-11-09 MED ORDER — LANTUS SOLOSTAR 100 UNIT/ML ~~LOC~~ SOPN: 8.0000 [IU] | PEN_INJECTOR | Freq: Every day | SUBCUTANEOUS | 3 refills | Status: DC

## 2022-11-09 MED ORDER — NOVOLOG FLEXPEN 100 UNIT/ML ~~LOC~~ SOPN: PEN_INJECTOR | SUBCUTANEOUS | 3 refills | Status: DC

## 2022-11-09 MED ORDER — INSULIN PEN NEEDLE 32G X 4 MM MISC: 1.0000 | Freq: Four times a day (QID) | 3 refills | Status: DC

## 2022-11-09 NOTE — Patient Instructions (Addendum)
  Decrease Lantus 8 units ONCE daily  Increase  Novolog 8 units with each meal  Novolog correctional insulin: ADD extra units on insulin to your meal-time Novolog dose if your blood sugars are higher than 180. Use the scale below to help guide you:   Blood sugar before meal Number of units to inject  Less than 180 0 unit  181 -  230 1 units  231 -  280 2 units  281 -  330 3 units  331 -  380 4 units  381 -  430 5 units      HOW TO TREAT LOW BLOOD SUGARS (Blood sugar LESS THAN 70 MG/DL) Please follow the RULE OF 15 for the treatment of hypoglycemia treatment (when your (blood sugars are less than 70 mg/dL)   STEP 1: Take 15 grams of carbohydrates when your blood sugar is low, which includes:  3-4 GLUCOSE TABS  OR 3-4 OZ OF JUICE OR REGULAR SODA OR ONE TUBE OF GLUCOSE GEL    STEP 2: RECHECK blood sugar in 15 MINUTES STEP 3: If your blood sugar is still low at the 15 minute recheck --> then, go back to STEP 1 and treat AGAIN with another 15 grams of carbohydrates.

## 2022-11-11 ENCOUNTER — Ambulatory Visit: Payer: BC Managed Care – PPO | Admitting: Orthopedic Surgery

## 2022-11-30 ENCOUNTER — Encounter (INDEPENDENT_AMBULATORY_CARE_PROVIDER_SITE_OTHER): Payer: BC Managed Care – PPO | Admitting: Ophthalmology

## 2022-11-30 DIAGNOSIS — E113313 Type 2 diabetes mellitus with moderate nonproliferative diabetic retinopathy with macular edema, bilateral: Secondary | ICD-10-CM | POA: Diagnosis not present

## 2022-11-30 DIAGNOSIS — H43813 Vitreous degeneration, bilateral: Secondary | ICD-10-CM

## 2022-12-15 ENCOUNTER — Other Ambulatory Visit: Payer: Self-pay | Admitting: Orthopedic Surgery

## 2022-12-28 ENCOUNTER — Encounter (INDEPENDENT_AMBULATORY_CARE_PROVIDER_SITE_OTHER): Payer: BC Managed Care – PPO | Admitting: Ophthalmology

## 2022-12-28 DIAGNOSIS — Z794 Long term (current) use of insulin: Secondary | ICD-10-CM | POA: Diagnosis not present

## 2022-12-28 DIAGNOSIS — E113313 Type 2 diabetes mellitus with moderate nonproliferative diabetic retinopathy with macular edema, bilateral: Secondary | ICD-10-CM | POA: Diagnosis not present

## 2022-12-28 DIAGNOSIS — H43813 Vitreous degeneration, bilateral: Secondary | ICD-10-CM | POA: Diagnosis not present

## 2022-12-29 ENCOUNTER — Other Ambulatory Visit: Payer: Self-pay | Admitting: Orthopedic Surgery

## 2022-12-29 DIAGNOSIS — Z0000 Encounter for general adult medical examination without abnormal findings: Secondary | ICD-10-CM

## 2023-01-25 ENCOUNTER — Encounter (INDEPENDENT_AMBULATORY_CARE_PROVIDER_SITE_OTHER): Payer: BC Managed Care – PPO | Admitting: Ophthalmology

## 2023-01-25 DIAGNOSIS — Z794 Long term (current) use of insulin: Secondary | ICD-10-CM | POA: Diagnosis not present

## 2023-01-25 DIAGNOSIS — H43813 Vitreous degeneration, bilateral: Secondary | ICD-10-CM | POA: Diagnosis not present

## 2023-01-25 DIAGNOSIS — E113313 Type 2 diabetes mellitus with moderate nonproliferative diabetic retinopathy with macular edema, bilateral: Secondary | ICD-10-CM

## 2023-01-28 ENCOUNTER — Ambulatory Visit
Admission: RE | Admit: 2023-01-28 | Discharge: 2023-01-28 | Disposition: A | Discharge status: Home / Self Care | Payer: BC Managed Care – PPO | Source: Ambulatory Visit | Attending: Orthopedic Surgery | Admitting: Orthopedic Surgery

## 2023-01-28 DIAGNOSIS — Z0000 Encounter for general adult medical examination without abnormal findings: Secondary | ICD-10-CM

## 2023-02-22 ENCOUNTER — Encounter (INDEPENDENT_AMBULATORY_CARE_PROVIDER_SITE_OTHER): Payer: BC Managed Care – PPO | Admitting: Ophthalmology

## 2023-02-22 DIAGNOSIS — E113313 Type 2 diabetes mellitus with moderate nonproliferative diabetic retinopathy with macular edema, bilateral: Secondary | ICD-10-CM

## 2023-02-22 DIAGNOSIS — H43813 Vitreous degeneration, bilateral: Secondary | ICD-10-CM

## 2023-02-22 DIAGNOSIS — Z794 Long term (current) use of insulin: Secondary | ICD-10-CM | POA: Diagnosis not present

## 2023-03-11 ENCOUNTER — Other Ambulatory Visit: Payer: Self-pay | Admitting: Orthopedic Surgery

## 2023-03-21 ENCOUNTER — Telehealth: Payer: Self-pay

## 2023-03-21 NOTE — Telephone Encounter (Signed)
Per faxed received from [facility] Lantus needs PA.

## 2023-03-22 ENCOUNTER — Other Ambulatory Visit (HOSPITAL_COMMUNITY): Payer: Self-pay

## 2023-03-22 ENCOUNTER — Encounter (INDEPENDENT_AMBULATORY_CARE_PROVIDER_SITE_OTHER): Payer: BC Managed Care – PPO | Admitting: Ophthalmology

## 2023-03-22 ENCOUNTER — Telehealth: Payer: Self-pay

## 2023-03-22 DIAGNOSIS — E113313 Type 2 diabetes mellitus with moderate nonproliferative diabetic retinopathy with macular edema, bilateral: Secondary | ICD-10-CM

## 2023-03-22 DIAGNOSIS — H43813 Vitreous degeneration, bilateral: Secondary | ICD-10-CM | POA: Diagnosis not present

## 2023-03-22 DIAGNOSIS — Z794 Long term (current) use of insulin: Secondary | ICD-10-CM

## 2023-03-22 NOTE — Telephone Encounter (Signed)
Patient states that her blood sugar have been in the 250 range every morning fasting. She takes 8 units of the fasting acting each meal and then sliding scale, long acting is 8 units at night. Would like to know if she needs to increase her night insulin.

## 2023-03-22 NOTE — Telephone Encounter (Signed)
She uses her reader most of the time so it's not coming through.

## 2023-03-22 NOTE — Telephone Encounter (Signed)
I left voicemail as well as sent recommendation in her Mychart

## 2023-04-19 ENCOUNTER — Encounter (INDEPENDENT_AMBULATORY_CARE_PROVIDER_SITE_OTHER): Payer: BC Managed Care – PPO | Admitting: Ophthalmology

## 2023-04-19 ENCOUNTER — Telehealth: Payer: Self-pay

## 2023-04-19 ENCOUNTER — Other Ambulatory Visit: Payer: Self-pay | Admitting: Internal Medicine

## 2023-04-19 MED ORDER — NOVOLOG FLEXPEN 100 UNIT/ML ~~LOC~~ SOPN: PEN_INJECTOR | SUBCUTANEOUS | 3 refills | Status: DC

## 2023-04-19 NOTE — Telephone Encounter (Signed)
Patient states Novolog needs to be increased in order for the pharmacy to fill it. She states that she uses more than the 30 units due the sliding scale and they want fill without new script.

## 2023-04-21 ENCOUNTER — Encounter (INDEPENDENT_AMBULATORY_CARE_PROVIDER_SITE_OTHER): Payer: BC Managed Care – PPO | Admitting: Ophthalmology

## 2023-04-21 DIAGNOSIS — H43813 Vitreous degeneration, bilateral: Secondary | ICD-10-CM | POA: Diagnosis not present

## 2023-04-21 DIAGNOSIS — E113313 Type 2 diabetes mellitus with moderate nonproliferative diabetic retinopathy with macular edema, bilateral: Secondary | ICD-10-CM | POA: Diagnosis not present

## 2023-04-21 DIAGNOSIS — Z794 Long term (current) use of insulin: Secondary | ICD-10-CM | POA: Diagnosis not present

## 2023-04-22 ENCOUNTER — Telehealth: Payer: Self-pay

## 2023-04-22 MED ORDER — NOVOLOG FLEXPEN 100 UNIT/ML ~~LOC~~ SOPN: PEN_INJECTOR | SUBCUTANEOUS | 3 refills | Status: DC

## 2023-04-22 NOTE — Telephone Encounter (Signed)
So the max daily dose has to be increased on the Novolog and not the Qty for the to fill. Right now it's still saying she can't fill until 04/26/23

## 2023-05-16 ENCOUNTER — Ambulatory Visit (INDEPENDENT_AMBULATORY_CARE_PROVIDER_SITE_OTHER): Payer: BC Managed Care – PPO | Admitting: Internal Medicine

## 2023-05-16 ENCOUNTER — Encounter: Payer: Self-pay | Admitting: Internal Medicine

## 2023-05-16 VITALS — BP 122/78 | HR 80 | Ht 62.0 in | Wt 155.0 lb

## 2023-05-16 DIAGNOSIS — E785 Hyperlipidemia, unspecified: Secondary | ICD-10-CM | POA: Diagnosis not present

## 2023-05-16 DIAGNOSIS — E039 Hypothyroidism, unspecified: Secondary | ICD-10-CM | POA: Diagnosis not present

## 2023-05-16 DIAGNOSIS — E10319 Type 1 diabetes mellitus with unspecified diabetic retinopathy without macular edema: Secondary | ICD-10-CM

## 2023-05-16 DIAGNOSIS — E1169 Type 2 diabetes mellitus with other specified complication: Secondary | ICD-10-CM

## 2023-05-16 LAB — COMPREHENSIVE METABOLIC PANEL
ALT: 12 U/L (ref 0–35)
AST: 14 U/L (ref 0–37)
Albumin: 4.2 g/dL (ref 3.5–5.2)
Alkaline Phosphatase: 68 U/L (ref 39–117)
BUN: 31 mg/dL — ABNORMAL HIGH (ref 6–23)
CO2: 24 meq/L (ref 19–32)
Calcium: 9.2 mg/dL (ref 8.4–10.5)
Chloride: 104 meq/L (ref 96–112)
Creatinine, Ser: 1.07 mg/dL (ref 0.40–1.20)
GFR: 54.37 mL/min — ABNORMAL LOW (ref >60.00)
Glucose, Bld: 131 mg/dL — ABNORMAL HIGH (ref 70–99)
Potassium: 4.3 meq/L (ref 3.5–5.1)
Sodium: 138 meq/L (ref 135–145)
Total Bilirubin: 0.4 mg/dL (ref 0.2–1.2)
Total Protein: 7.1 g/dL (ref 6.0–8.3)

## 2023-05-16 LAB — POCT GLYCOSYLATED HEMOGLOBIN (HGB A1C): Hemoglobin A1C: 6.1 % — AB (ref 4.0–5.6)

## 2023-05-16 LAB — TSH: TSH: 9.14 u[IU]/mL — ABNORMAL HIGH (ref 0.35–5.50)

## 2023-05-16 LAB — LIPID PANEL
Cholesterol: 306 mg/dL — ABNORMAL HIGH (ref 0–200)
HDL: 96.2 mg/dL (ref >39.00)
LDL Cholesterol: 196 mg/dL — ABNORMAL HIGH (ref 0–99)
NonHDL: 209.67
Total CHOL/HDL Ratio: 3
Triglycerides: 67 mg/dL (ref 0.0–149.0)
VLDL: 13.4 mg/dL (ref 0.0–40.0)

## 2023-05-16 LAB — MICROALBUMIN / CREATININE URINE RATIO
Creatinine,U: 240.5 mg/dL
Microalb Creat Ratio: 1.4 mg/g (ref 0.0–30.0)
Microalb, Ur: 3.5 mg/dL — ABNORMAL HIGH (ref 0.0–1.9)

## 2023-05-16 MED ORDER — LANTUS SOLOSTAR 100 UNIT/ML ~~LOC~~ SOPN: 6.0000 [IU] | PEN_INJECTOR | Freq: Every day | SUBCUTANEOUS | 3 refills | Status: DC

## 2023-05-16 MED ORDER — NOVOLOG FLEXPEN 100 UNIT/ML ~~LOC~~ SOPN: PEN_INJECTOR | SUBCUTANEOUS | 3 refills | Status: DC

## 2023-05-16 MED ORDER — INSULIN PEN NEEDLE 32G X 4 MM MISC: 1.0000 | Freq: Four times a day (QID) | 3 refills | Status: DC

## 2023-05-16 MED ORDER — DEXCOM G7 SENSOR MISC: 1.0000 | 3 refills | Status: DC

## 2023-05-16 NOTE — Patient Instructions (Addendum)
  Decrease Lantus 6 units ONCE daily  Increase  Novolog 10 units with Breakfast, 10 units with Lunch and 12 units with Supper Novolog correctional insulin: ADD extra units on insulin to your meal-time Novolog dose if your blood sugars are higher than 180. Use the scale below to help guide you:   Blood sugar before meal Number of units to inject  Less than 180 0 unit  181 -  230 1 units  231 -  280 2 units  281 -  330 3 units  331 -  380 4 units  381 -  430 5 units      HOW TO TREAT LOW BLOOD SUGARS (Blood sugar LESS THAN 70 MG/DL) Please follow the RULE OF 15 for the treatment of hypoglycemia treatment (when your (blood sugars are less than 70 mg/dL)   STEP 1: Take 15 grams of carbohydrates when your blood sugar is low, which includes:  3-4 GLUCOSE TABS  OR 3-4 OZ OF JUICE OR REGULAR SODA OR ONE TUBE OF GLUCOSE GEL    STEP 2: RECHECK blood sugar in 15 MINUTES STEP 3: If your blood sugar is still low at the 15 minute recheck --> then, go back to STEP 1 and treat AGAIN with another 15 grams of carbohydrates.

## 2023-05-16 NOTE — Progress Notes (Unsigned)
Name: Alicia Andrews  Age/ Sex: 67 y.o., female   MRN/ DOB: [id number], May 18, 1956     PCP: Octavia Heir, NP   Reason for Endocrinology Evaluation: Type 1 Diabetes Mellitus  Initial Endocrine Consultative Visit: 04/11/2020    PATIENT IDENTIFIER: Alicia Andrews is a 67 y.o. female with a past medical history of DM and dyslipidemia. The patient has followed with Endocrinology clinic since 04/11/2020 for consultative assistance with management of her diabetes.  DIABETIC HISTORY:  Alicia Andrews was diagnosed with DM 1993.  Patient was initially diagnosed as type 2 DM , but her diagnosis was changed to type1 DM by her previous endocrinologist after a DKA admission in 01/2019.  Insulin started in 2020. Her hemoglobin A1c has ranged from 6.7% in 2023, peaking at 130% in 2020.  Patient was followed by Alicia Andrews from 2021 until March 2023   On her initial visit with me she had an A1c of 6.6% but the patient was having recurrent hypoglycemic episodes with a serum glucose of 22 mg/DL 11/979.  I had switched her from human insulin to insulin analogs by November, 2023  SUBJECTIVE:   During the last visit (11/09/2022): A1c 6.5%     Today (05/16/2023): Alicia Andrews is here for follow-up on diabetes management.  She checks her blood sugars multiple  times daily though CGM. The patient has  had hypoglycemic episodes since the last clinic visit, , patient is symptomatic with these episodes.    Denies nausea, vomiting Has chronic constipation  but no  diarrhea   She continues to follow-up with ophthalmology for diabetic retinopathy, she receives monthly injections, DR stable  HOME DIABETES REGIMEN:  Lantus 8 units daily NovoLog 8 /8/10  times daily before every meal Correction factor: NovoLog (BG -130/50)     Statin: Yes ACE-I/ARB: No   CONTINUOUS GLUCOSE MONITORING RECORD INTERPRETATION : Unable to download     DIABETIC COMPLICATIONS: Microvascular complications:  DR B/L  on injections,  Denies: neuropathy Last Eye Exam: Completed 2023  Macrovascular complications:   Denies: CAD, CVA, PVD   HISTORY:  Past Medical History:  Past Medical History:  Diagnosis Date   Diabetes mellitus without complication ([facility])    Type 2    Past Surgical History:  Past Surgical History:  Procedure Laterality Date   DIAGNOSTIC MAMMOGRAM  2022   Per new patient packet   TONSILLECTOMY AND ADENOIDECTOMY  1963   Per new patient packet   Social History:  reports that she has never smoked. She has never used smokeless tobacco. She reports that she does not drink alcohol and does not use drugs. Family History:  Family History  Problem Relation Age of Onset   Breast cancer Mother 3   Diabetes Father    Cancer Maternal Grandmother    Dementia Maternal Grandmother      HOME MEDICATIONS: Allergies as of 05/16/2023   No Known Allergies      Medication List        Accurate as of May 16, 2023  2:36 PM. If you have any questions, ask your nurse or doctor.          STOP taking these medications    Dexcom G6 Transmitter Misc Stopped by: Alicia Andrews       TAKE these medications    Dexcom G7 Sensor Misc 1 Device by Does not apply route as directed. What changed: See the new instructions. Changed by: Alicia Andrews   Insulin Pen Needle 32G  X 4 MM Misc 1 Device by Does not apply route in the morning, at noon, in the evening, and at bedtime.   Lantus SoloStar 100 UNIT/ML Solostar Pen Generic drug: insulin glargine Inject 6 Units into the skin daily. What changed: how much to take Changed by: Alicia Andrews   NovoLOG FlexPen 100 UNIT/ML FlexPen Generic drug: insulin aspart Max daily 45 units   rosuvastatin 20 MG tablet Commonly known as: Crestor Take 1 tablet (20 mg total) by mouth daily.         OBJECTIVE:   Vital Signs: BP 122/78 (BP Location: Left Arm, Patient Position: Sitting, Cuff Size: Large)   Pulse 80    Ht 5\' 2"  (1.575 m)   Wt 155 lb (70.3 kg)   SpO2 94%   BMI 28.35 kg/m   Wt Readings from Last 3 Encounters:  05/16/23 155 lb (70.3 kg)  11/09/22 149 lb (67.6 kg)  07/05/22 139 lb 6.4 oz (63.2 kg)     Exam: General: Pt appears well and is in NAD  Lungs: Clear with good BS bilat   Heart: RRR   Extremities: No pretibial edema.   Neuro: MS is good with appropriate affect, pt is alert and Ox3      DM Foot Exam 11/09/2022  The skin of the feet is intact without sores or ulcerations. The pedal pulses are 1+ on right and 1+ on left. The sensation is intact to a screening 5.07, 10 gram monofilament bilaterally    DATA REVIEWED:  Lab Results  Component Value Date   HGBA1C 6.1 (A) 05/16/2023   HGBA1C 6.5 (A) 11/09/2022   HGBA1C 6.6 (A) 07/05/2022   Lab Results  Component Value Date   MICROALBUR 3.5 (H) 05/16/2023   LDLCALC 196 (H) 05/16/2023   CREATININE 1.07 05/16/2023   Lab Results  Component Value Date   MICRALBCREAT 1.4 05/16/2023     Lab Results  Component Value Date   CHOL 306 (H) 05/16/2023   HDL 96.20 05/16/2023   LDLCALC 196 (H) 05/16/2023   TRIG 67.0 05/16/2023   CHOLHDL 3 05/16/2023         ASSESSMENT / PLAN / RECOMMENDATIONS:   1) Type 1 Diabetes Mellitus, Optimally controlled, With retinopathic complications - Most recent A1c of 6.1%. Goal A1c < 7.0 %.    -A1c low at 6.1%, patient with recurrent hypoglycemia overnight -We were unable to download the Dexcom today, I was able to manually review her Dexcom for the past 24 hours, she has been noted with hypoglycemia overnight and postprandial hyperglycemia, I will decrease her basal insulin as below and increase NovoLog before each meal -Patient encouraged to contact the office with hypoglycemic episodes  MEDICATIONS:  Decrease Lantus 6 units daily Increase NovoLog 05/26/11  Continue correction factor : NovoLog (BG -130/50) TIDQAC  EDUCATION / INSTRUCTIONS: BG monitoring instructions: Patient is  instructed to check her blood sugars 3 times a day, before meals . Call [place] [facility] if: BG persistently < 70  I reviewed the Rule of 15 for the treatment of hypoglycemia in detail with the patient. Literature supplied.    2) Diabetic complications:  Eye: Does have known diabetic retinopathy.  Neuro/ Feet: Does not have known diabetic peripheral neuropathy .  Renal: Patient does not have known baseline CKD. She   is not on an ACEI/ARB at present.    3) Dyslipidemia:  -LDL levels above goal June September, 2023 labs -She is already on rosuvastatin 20 mg daily -We discussed  cardiovascular benefits of statin therapy and optimizing LDL levels, I have recommended starting fenofibrate due to DR      F/U in 6 months      Signed electronically by: Lyndle Herrlich, MD  [facility]  [facility] [address] [place] [place], [place] [address] Phone: [phone] FAX: [phone]   CC: Octavia Heir, NP [address] [place] [address] Phone: [phone]  Fax: [phone]  Return to Endocrinology clinic as below: Future Appointments  Date Time Provider Department Center  05/19/2023  1:15 PM Sherrie George, MD [facility] None  11/16/2023  9:30 AM Peightyn Roberson, Konrad Dolores, MD [facility] None

## 2023-05-17 DIAGNOSIS — E039 Hypothyroidism, unspecified: Secondary | ICD-10-CM | POA: Insufficient documentation

## 2023-05-17 MED ORDER — FENOFIBRATE 145 MG PO TABS: 145.0000 mg | ORAL_TABLET | Freq: Every day | ORAL | 3 refills | Status: DC

## 2023-05-17 MED ORDER — LEVOTHYROXINE SODIUM 25 MCG PO TABS: 25.0000 ug | ORAL_TABLET | Freq: Every day | ORAL | 3 refills | Status: DC

## 2023-05-17 MED ORDER — ROSUVASTATIN CALCIUM 20 MG PO TABS
20.0000 mg | ORAL_TABLET | Freq: Every day | ORAL | 3 refills | Status: DC
Start: 2023-05-17 — End: 2023-11-22

## 2023-05-19 ENCOUNTER — Encounter (INDEPENDENT_AMBULATORY_CARE_PROVIDER_SITE_OTHER): Payer: BC Managed Care – PPO | Admitting: Ophthalmology

## 2023-05-19 DIAGNOSIS — E113313 Type 2 diabetes mellitus with moderate nonproliferative diabetic retinopathy with macular edema, bilateral: Secondary | ICD-10-CM

## 2023-05-19 DIAGNOSIS — Z794 Long term (current) use of insulin: Secondary | ICD-10-CM

## 2023-05-19 DIAGNOSIS — H43813 Vitreous degeneration, bilateral: Secondary | ICD-10-CM | POA: Diagnosis not present

## 2023-05-20 ENCOUNTER — Other Ambulatory Visit: Payer: Self-pay | Admitting: Orthopedic Surgery

## 2023-05-20 DIAGNOSIS — Z1211 Encounter for screening for malignant neoplasm of colon: Secondary | ICD-10-CM

## 2023-05-20 DIAGNOSIS — Z1212 Encounter for screening for malignant neoplasm of rectum: Secondary | ICD-10-CM

## 2023-05-30 ENCOUNTER — Encounter: Payer: Self-pay | Admitting: Internal Medicine

## 2023-05-31 ENCOUNTER — Other Ambulatory Visit: Payer: Self-pay

## 2023-05-31 MED ORDER — DEXCOM G7 RECEIVER DEVI: 0 refills | Status: DC

## 2023-06-16 ENCOUNTER — Encounter (INDEPENDENT_AMBULATORY_CARE_PROVIDER_SITE_OTHER): Payer: BC Managed Care – PPO | Admitting: Ophthalmology

## 2023-06-16 DIAGNOSIS — E113313 Type 2 diabetes mellitus with moderate nonproliferative diabetic retinopathy with macular edema, bilateral: Secondary | ICD-10-CM | POA: Diagnosis not present

## 2023-06-16 DIAGNOSIS — Z794 Long term (current) use of insulin: Secondary | ICD-10-CM | POA: Diagnosis not present

## 2023-06-16 DIAGNOSIS — H43813 Vitreous degeneration, bilateral: Secondary | ICD-10-CM | POA: Diagnosis not present

## 2023-07-18 ENCOUNTER — Ambulatory Visit: Payer: BC Managed Care – PPO | Admitting: Orthopedic Surgery

## 2023-07-18 ENCOUNTER — Ambulatory Visit (INDEPENDENT_AMBULATORY_CARE_PROVIDER_SITE_OTHER): Payer: BC Managed Care – PPO | Admitting: Adult Health

## 2023-07-18 VITALS — BP 132/78 | HR 92 | Temp 97.5°F | Resp 18 | Ht 62.0 in | Wt 159.6 lb

## 2023-07-18 DIAGNOSIS — E1110 Type 2 diabetes mellitus with ketoacidosis without coma: Secondary | ICD-10-CM | POA: Diagnosis not present

## 2023-07-18 DIAGNOSIS — E039 Hypothyroidism, unspecified: Secondary | ICD-10-CM

## 2023-07-18 DIAGNOSIS — R051 Acute cough: Secondary | ICD-10-CM | POA: Diagnosis not present

## 2023-07-18 DIAGNOSIS — Z794 Long term (current) use of insulin: Secondary | ICD-10-CM | POA: Diagnosis not present

## 2023-07-18 MED ORDER — BENZONATATE 100 MG PO CAPS
100.0000 mg | ORAL_CAPSULE | Freq: Three times a day (TID) | ORAL | 0 refills | Status: DC | PRN
Start: 2023-07-18 — End: 2023-11-22

## 2023-07-18 NOTE — Progress Notes (Unsigned)
[facility] clinic  Provider:  Kenard Gower DNP  Code Status:  Full Code  Goals of Care:     05/13/2022    9:26 AM  Advanced Directives  Does Patient Have a Medical Advance Directive? No  Would patient like information on creating a medical advance directive? Yes (Inpatient - patient defers creating a medical advance directive at this time - Information given)     Chief Complaint  Patient presents with   Cough    Acute cough and congestion     HPI: Patient is a 67 y.o. female seen today for an acute visit for Tickle on throat a week ago Cough and congestion, no fever, no chills No sick exposure Appetite is good No body aches -  POC COVID-19 was negative  Type 2 diabetes mellitus with ketoacidosis without coma, with long-term current use of insulin (HCC) -  blood sugar this morning 140, takes Novolog and Lantus  Hypothyroidism, unspecified type - tsh 9.14,05/16/23, takes Levothyroxine  Past Medical History:  Diagnosis Date   Diabetes mellitus without complication (HCC)    Type 2     Past Surgical History:  Procedure Laterality Date   DIAGNOSTIC MAMMOGRAM  2022   Per new patient packet   TONSILLECTOMY AND ADENOIDECTOMY  1963   Per new patient packet    No Known Allergies  Outpatient Encounter Medications as of 07/18/2023  Medication Sig   Continuous Glucose Receiver (DEXCOM G7 RECEIVER) DEVI Use to monitor blood sugars   Continuous Glucose Sensor (DEXCOM G7 SENSOR) MISC 1 Device by Does not apply route as directed.   fenofibrate (TRICOR) 145 MG tablet Take 1 tablet (145 mg total) by mouth daily.   insulin aspart (NOVOLOG FLEXPEN) 100 UNIT/ML FlexPen Max daily 45 units   insulin glargine (LANTUS SOLOSTAR) 100 UNIT/ML Solostar Pen Inject 6 Units into the skin daily.   Insulin Pen Needle 32G X 4 MM MISC 1 Device by Does not apply route in the morning, at noon, in the evening, and at bedtime.   levothyroxine (SYNTHROID) 25 MCG tablet Take 1 tablet (25 mcg total)  by mouth daily.   rosuvastatin (CRESTOR) 20 MG tablet Take 1 tablet (20 mg total) by mouth daily.   No facility-administered encounter medications on file as of 07/18/2023.    Review of Systems:  Review of Systems  Constitutional:  Positive for appetite change. Negative for chills, fatigue and fever.  HENT:  Negative for congestion, hearing loss, rhinorrhea and sore throat.   Eyes: Negative.   Respiratory:  Positive for cough. Negative for shortness of breath and wheezing.   Cardiovascular:  Negative for chest pain, palpitations and leg swelling.  Gastrointestinal:  Negative for abdominal pain, constipation, diarrhea, nausea and vomiting.  Genitourinary:  Negative for dysuria.  Musculoskeletal:  Negative for arthralgias, back pain and myalgias.  Skin:  Negative for color change, rash and wound.  Neurological:  Negative for dizziness, weakness and headaches.  Psychiatric/Behavioral:  Negative for behavioral problems. The patient is not nervous/anxious.     Health Maintenance  Topic Date Due   Fecal DNA (Cologuard)  Never done   Zoster Vaccines- Shingrix (1 of 2) Never done   Pneumonia Vaccine 41+ Years old (2 of 2 - PCV) 07/01/2022   DEXA SCAN  Never done   OPHTHALMOLOGY EXAM  10/15/2022   INFLUENZA VACCINE  Never done   COVID-19 Vaccine (5 - 2023-24 season) 04/17/2023   FOOT EXAM  11/09/2023   HEMOGLOBIN A1C  11/13/2023   Diabetic  kidney evaluation - eGFR measurement  05/15/2024   Diabetic kidney evaluation - Urine ACR  05/15/2024   MAMMOGRAM  01/27/2025   DTaP/Tdap/Td (2 - Td or Tdap) 11/24/2026   Hepatitis C Screening  Completed   HPV VACCINES  Aged Out    Physical Exam: Vitals:   07/18/23 1521  BP: 132/78  Pulse: 92  Resp: 18  Temp: (!) 97.5 F (36.4 C)  SpO2: 91%  Weight: 159 lb 9.6 oz (72.4 kg)  Height: 5\' 2"  (1.575 m)   Body mass index is 29.19 kg/m. Physical Exam Constitutional:      Appearance: Normal appearance.  HENT:     Head: Normocephalic and  atraumatic.     Nose: Nose normal.     Mouth/Throat:     Mouth: Mucous membranes are moist.  Eyes:     Conjunctiva/sclera: Conjunctivae normal.  Cardiovascular:     Rate and Rhythm: Normal rate and regular rhythm.  Pulmonary:     Effort: Pulmonary effort is normal.     Breath sounds: Normal breath sounds.  Abdominal:     General: Bowel sounds are normal.     Palpations: Abdomen is soft.  Musculoskeletal:        General: Normal range of motion.     Cervical back: Normal range of motion.  Skin:    General: Skin is warm and dry.  Neurological:     General: No focal deficit present.     Mental Status: She is alert and oriented to person, place, and time.  Psychiatric:        Mood and Affect: Mood normal.        Behavior: Behavior normal.        Thought Content: Thought content normal.        Judgment: Judgment normal.     Labs reviewed: Basic Metabolic Panel: Recent Labs    05/16/23 0949  NA 138  K 4.3  CL 104  CO2 24  GLUCOSE 131*  BUN 31*  CREATININE 1.07  CALCIUM 9.2  TSH 9.14*   Liver Function Tests: Recent Labs    05/16/23 0949  AST 14  ALT 12  ALKPHOS 68  BILITOT 0.4  PROT 7.1  ALBUMIN 4.2   No results for input(s): "LIPASE", "AMYLASE" in the last 8760 hours. No results for input(s): "AMMONIA" in the last 8760 hours. CBC: No results for input(s): "WBC", "NEUTROABS", "HGB", "HCT", "MCV", "PLT" in the last 8760 hours. Lipid Panel: Recent Labs    05/16/23 0949  CHOL 306*  HDL 96.20  LDLCALC 196*  TRIG 67.0  CHOLHDL 3   Lab Results  Component Value Date   HGBA1C 6.1 (A) 05/16/2023    Procedures since last visit: No results found.  Assessment/Plan    Labs/tests ordered:   CBC, TSH, COVID-19 POC  Next appt:  Visit date not found

## 2023-07-19 LAB — CBC WITH DIFFERENTIAL/PLATELET
Absolute Lymphocytes: 1264 {cells}/uL (ref 850–3900)
Absolute Monocytes: 798 {cells}/uL (ref 200–950)
Basophils Absolute: 40 {cells}/uL (ref 0–200)
Basophils Relative: 0.5 %
Eosinophils Absolute: 87 {cells}/uL (ref 15–500)
Eosinophils Relative: 1.1 %
HCT: 33.5 % — ABNORMAL LOW (ref 35.0–45.0)
Hemoglobin: 10.6 g/dL — ABNORMAL LOW (ref 11.7–15.5)
MCH: 30 pg (ref 27.0–33.0)
MCHC: 31.6 g/dL — ABNORMAL LOW (ref 32.0–36.0)
MCV: 94.9 fL (ref 80.0–100.0)
MPV: 9.5 fL (ref 7.5–12.5)
Monocytes Relative: 10.1 %
Neutro Abs: 5712 {cells}/uL (ref 1500–7800)
Neutrophils Relative %: 72.3 %
Platelets: 369 10*3/uL (ref 140–400)
RBC: 3.53 10*6/uL — ABNORMAL LOW (ref 3.80–5.10)
RDW: 12 % (ref 11.0–15.0)
Total Lymphocyte: 16 %
WBC: 7.9 10*3/uL (ref 3.8–10.8)

## 2023-07-19 LAB — TSH: TSH: 4.44 m[IU]/L (ref 0.40–4.50)

## 2023-07-19 LAB — POC COVID19 BINAXNOW: SARS Coronavirus 2 Ag: NEGATIVE

## 2023-07-19 NOTE — Progress Notes (Signed)
-    Hgb 10.6, slightly down from 11.6 (taken a year ago) -   tsh normal

## 2023-07-20 ENCOUNTER — Telehealth: Payer: BC Managed Care – PPO | Admitting: Physician Assistant

## 2023-07-20 DIAGNOSIS — B9689 Other specified bacterial agents as the cause of diseases classified elsewhere: Secondary | ICD-10-CM | POA: Diagnosis not present

## 2023-07-20 DIAGNOSIS — J208 Acute bronchitis due to other specified organisms: Secondary | ICD-10-CM | POA: Diagnosis not present

## 2023-07-20 MED ORDER — DOXYCYCLINE HYCLATE 100 MG PO TABS: 100.0000 mg | ORAL_TABLET | Freq: Two times a day (BID) | ORAL | 0 refills | Status: DC

## 2023-07-20 MED ORDER — PROMETHAZINE-DM 6.25-15 MG/5ML PO SYRP
5.0000 mL | ORAL_SOLUTION | Freq: Four times a day (QID) | ORAL | 0 refills | Status: DC | PRN
Start: 2023-07-20 — End: 2023-11-22

## 2023-07-20 NOTE — Progress Notes (Signed)
I have spent 5 minutes in review of e-visit questionnaire, review and updating patient chart, medical decision making and response to patient.   Mia Milan Cody Jacklynn Dehaas, PA-C    

## 2023-07-20 NOTE — Progress Notes (Signed)
E-Visit for Cough   We are sorry that you are not feeling well.  Here is how we plan to help!  Based on your presentation I believe you most likely have A cough due to bacteria.  When patients have a productive cough with a change in color or increased sputum production, we are concerned about bacterial bronchitis.  If left untreated it can progress to pneumonia.  If your symptoms do not improve with your treatment plan it is important that you contact your provider.   I have prescribed Doxycycline 100 mg twice a day for 7 days     In addition you may continue the use of the Benzonatate, but I have added on a prescription cough syrup as well.   From your responses in the eVisit questionnaire you describe inflammation in the upper respiratory tract which is causing a significant cough.  This is commonly called Bronchitis and has four common causes:   Allergies Viral Infections Acid Reflux Bacterial Infection Allergies, viruses and acid reflux are treated by controlling symptoms or eliminating the cause. An example might be a cough caused by taking certain blood pressure medications. You stop the cough by changing the medication. Another example might be a cough caused by acid reflux. Controlling the reflux helps control the cough.  USE OF BRONCHODILATOR ("RESCUE") INHALERS: There is a risk from using your bronchodilator too frequently.  The risk is that over-reliance on a medication which only relaxes the muscles surrounding the breathing tubes can reduce the effectiveness of medications prescribed to reduce swelling and congestion of the tubes themselves.  Although you feel brief relief from the bronchodilator inhaler, your asthma may actually be worsening with the tubes becoming more swollen and filled with mucus.  This can delay other crucial treatments, such as oral steroid medications. If you need to use a bronchodilator inhaler daily, several times per day, you should discuss this with your  provider.  There are probably better treatments that could be used to keep your asthma under control.     HOME CARE Only take medications as instructed by your medical team. Complete the entire course of an antibiotic. Drink plenty of fluids and get plenty of rest. Avoid close contacts especially the very young and the elderly Cover your mouth if you cough or cough into your sleeve. Always remember to wash your hands A steam or ultrasonic humidifier can help congestion.   GET HELP RIGHT AWAY IF: You develop worsening fever. You become short of breath You cough up blood. Your symptoms persist after you have completed your treatment plan MAKE SURE YOU  Understand these instructions. Will watch your condition. Will get help right away if you are not doing well or get worse.    Thank you for choosing an e-visit.  Your e-visit answers were reviewed by a board certified advanced clinical practitioner to complete your personal care plan. Depending upon the condition, your plan could have included both over the counter or prescription medications.  Please review your pharmacy choice. Make sure the pharmacy is open so you can pick up prescription now. If there is a problem, you may contact your provider through [employer] and have the prescription routed to another pharmacy.  Your safety is important to [place]. If you have drug allergies check your prescription carefully.   For the next 24 hours you can use MyChart to ask questions about today's visit, request a non-urgent call back, or ask for a work or school excuse. You will  get an email in the next two days asking about your experience. I hope that your e-visit has been valuable and will speed your recovery.

## 2023-07-21 ENCOUNTER — Encounter (INDEPENDENT_AMBULATORY_CARE_PROVIDER_SITE_OTHER): Payer: BC Managed Care – PPO | Admitting: Ophthalmology

## 2023-07-26 ENCOUNTER — Encounter (INDEPENDENT_AMBULATORY_CARE_PROVIDER_SITE_OTHER): Payer: BC Managed Care – PPO | Admitting: Ophthalmology

## 2023-07-26 DIAGNOSIS — H43813 Vitreous degeneration, bilateral: Secondary | ICD-10-CM | POA: Diagnosis not present

## 2023-07-26 DIAGNOSIS — Z794 Long term (current) use of insulin: Secondary | ICD-10-CM | POA: Diagnosis not present

## 2023-07-26 DIAGNOSIS — E113313 Type 2 diabetes mellitus with moderate nonproliferative diabetic retinopathy with macular edema, bilateral: Secondary | ICD-10-CM | POA: Diagnosis not present

## 2023-08-31 ENCOUNTER — Encounter (INDEPENDENT_AMBULATORY_CARE_PROVIDER_SITE_OTHER): Payer: BC Managed Care – PPO | Admitting: Ophthalmology

## 2023-08-31 DIAGNOSIS — Z794 Long term (current) use of insulin: Secondary | ICD-10-CM

## 2023-08-31 DIAGNOSIS — E113313 Type 2 diabetes mellitus with moderate nonproliferative diabetic retinopathy with macular edema, bilateral: Secondary | ICD-10-CM

## 2023-08-31 DIAGNOSIS — H43813 Vitreous degeneration, bilateral: Secondary | ICD-10-CM | POA: Diagnosis not present

## 2023-10-04 ENCOUNTER — Encounter (INDEPENDENT_AMBULATORY_CARE_PROVIDER_SITE_OTHER): Payer: BC Managed Care – PPO | Admitting: Ophthalmology

## 2023-10-04 DIAGNOSIS — E113313 Type 2 diabetes mellitus with moderate nonproliferative diabetic retinopathy with macular edema, bilateral: Secondary | ICD-10-CM

## 2023-10-04 DIAGNOSIS — H43813 Vitreous degeneration, bilateral: Secondary | ICD-10-CM | POA: Diagnosis not present

## 2023-10-04 DIAGNOSIS — Z794 Long term (current) use of insulin: Secondary | ICD-10-CM | POA: Diagnosis not present

## 2023-11-10 ENCOUNTER — Encounter (INDEPENDENT_AMBULATORY_CARE_PROVIDER_SITE_OTHER): Payer: BC Managed Care – PPO | Admitting: Ophthalmology

## 2023-11-10 DIAGNOSIS — H43813 Vitreous degeneration, bilateral: Secondary | ICD-10-CM

## 2023-11-10 DIAGNOSIS — E113313 Type 2 diabetes mellitus with moderate nonproliferative diabetic retinopathy with macular edema, bilateral: Secondary | ICD-10-CM

## 2023-11-10 DIAGNOSIS — Z794 Long term (current) use of insulin: Secondary | ICD-10-CM | POA: Diagnosis not present

## 2023-11-16 ENCOUNTER — Ambulatory Visit (INDEPENDENT_AMBULATORY_CARE_PROVIDER_SITE_OTHER): Payer: BC Managed Care – PPO | Admitting: Internal Medicine

## 2023-11-16 ENCOUNTER — Encounter: Payer: Self-pay | Admitting: Internal Medicine

## 2023-11-16 VITALS — BP 122/78 | HR 85 | Ht 62.0 in | Wt 164.0 lb

## 2023-11-16 DIAGNOSIS — E785 Hyperlipidemia, unspecified: Secondary | ICD-10-CM

## 2023-11-16 DIAGNOSIS — E10319 Type 1 diabetes mellitus with unspecified diabetic retinopathy without macular edema: Secondary | ICD-10-CM

## 2023-11-16 DIAGNOSIS — E039 Hypothyroidism, unspecified: Secondary | ICD-10-CM | POA: Diagnosis not present

## 2023-11-16 LAB — POCT GLYCOSYLATED HEMOGLOBIN (HGB A1C): Hemoglobin A1C: 6.8 % — AB (ref 4.0–5.6)

## 2023-11-16 LAB — LIPID PANEL
Cholesterol: 200 mg/dL — ABNORMAL HIGH (ref <200)
HDL: 95 mg/dL (ref >=50)
LDL Cholesterol (Calc): 91 mg/dL
Non-HDL Cholesterol (Calc): 105 mg/dL (ref <130)
Total CHOL/HDL Ratio: 2.1 (calc) (ref <5.0)
Triglycerides: 57 mg/dL (ref <150)

## 2023-11-16 LAB — TSH: TSH: 8.63 m[IU]/L — ABNORMAL HIGH (ref 0.40–4.50)

## 2023-11-16 MED ORDER — LANTUS SOLOSTAR 100 UNIT/ML ~~LOC~~ SOPN: 8.0000 [IU] | PEN_INJECTOR | Freq: Every day | SUBCUTANEOUS | 3 refills | Status: DC

## 2023-11-16 NOTE — Patient Instructions (Addendum)
 Increase  Lantus 8 units ONCE daily  Change  Novolog 10 units with each meal  Novolog correctional insulin: ADD extra units on insulin to your meal-time Novolog dose if your blood sugars are higher than 180. Use the scale below to help guide you:   Blood sugar before meal Number of units to inject  Less than 180 0 unit  181 -  230 1 units  231 -  280 2 units  281 -  330 3 units  331 -  380 4 units  381 -  430 5 units   Please check out the Tandem/T slim pump and Omnipod 5 pump   HOW TO TREAT LOW BLOOD SUGARS (Blood sugar LESS THAN 70 MG/DL) Please follow the RULE OF 15 for the treatment of hypoglycemia treatment (when your (blood sugars are less than 70 mg/dL)   STEP 1: Take 15 grams of carbohydrates when your blood sugar is low, which includes:  3-4 GLUCOSE TABS  OR 3-4 OZ OF JUICE OR REGULAR SODA OR ONE TUBE OF GLUCOSE GEL    STEP 2: RECHECK blood sugar in 15 MINUTES STEP 3: If your blood sugar is still low at the 15 minute recheck --> then, go back to STEP 1 and treat AGAIN with another 15 grams of carbohydrates.

## 2023-11-16 NOTE — Progress Notes (Unsigned)
 Name: Alicia Andrews  Age/ Sex: 68 y.o., female   MRN/ DOB: [id number], 1956-04-05     PCP: Octavia Heir, NP   Reason for Endocrinology Evaluation: Type 1 Diabetes Mellitus  Initial Endocrine Consultative Visit: 04/11/2020    PATIENT IDENTIFIER: Alicia Andrews is a 68 y.o. female with a past medical history of DM and dyslipidemia. The patient has followed with Endocrinology clinic since 04/11/2020 for consultative assistance with management of her diabetes.  DIABETIC HISTORY:  Alicia Andrews was diagnosed with DM 1993.  Patient was initially diagnosed as type 2 DM , but her diagnosis was changed to type1 DM by her previous endocrinologist after a DKA admission in 01/2019.  Insulin started in 2020. Her hemoglobin A1c has ranged from 6.7% in 2023, peaking at 130% in 2020.  Patient was followed by Dr. Everardo All from 2021 until March 2023   On her initial visit with me she had an A1c of 6.6% but the patient was having recurrent hypoglycemic episodes with a serum glucose of 22 mg/DL 08/8297.  I had switched her from human insulin to insulin analogs by November, 2023    THYROID HISTORY: Patient was noted with an elevated TSH of 9.14u IU/mL during labs 04/2023.  Was started on LT-4 replacement at this time.    SUBJECTIVE:   During the last visit (05/16/2023): A1c 6.1%     Today (11/16/2023): Alicia Andrews is here for follow-up on diabetes management.  She checks her blood sugars multiple  times daily though CGM. The patient has  had hypoglycemic episodes since the last clinic visit, , patient is symptomatic with these episodes.    Denies nausea, vomiting Continues with  chronic constipation   Denies local neck swelling  She continues to follow-up with ophthalmology for diabetic retinopathy, she receives monthly injections, DR stable   HOME DIABETES REGIMEN:  Lantus 6 units daily NovoLog 05/26/11  times daily before every meal Correction factor: NovoLog (BG  -130/50) Rosuvastatin 20 mg daily Fenofibrate 145 Mg daily Levothyroxine 25 mcg daily    Statin: Yes ACE-I/ARB: No  CONTINUOUS GLUCOSE MONITORING RECORD INTERPRETATION    Dates of Recording: 3/20-11/16/2023  Sensor description: dexcom  Results statistics:   CGM use % of time 95  Average and SD 158/67  Time in range    63  %  % Time Above 180 22  % Time above 250 11  % Time Below target 4   Glycemic patterns summary: BGs trend up overnight and fluctuate during the day  Hyperglycemic episodes postprandial  Hypoglycemic episodes occurred after supper  Overnight periods: Variable    DIABETIC COMPLICATIONS: Microvascular complications:  DR B/L on injections,  Denies: neuropathy Last Eye Exam: Completed 2023  Macrovascular complications:   Denies: CAD, CVA, PVD   HISTORY:  Past Medical History:  Past Medical History:  Diagnosis Date   Diabetes mellitus without complication (HCC)    Type 2    Past Surgical History:  Past Surgical History:  Procedure Laterality Date   DIAGNOSTIC MAMMOGRAM  2022   Per new patient packet   TONSILLECTOMY AND ADENOIDECTOMY  1963   Per new patient packet   Social History:  reports that she has never smoked. She has never used smokeless tobacco. She reports that she does not drink alcohol and does not use drugs. Family History:  Family History  Problem Relation Age of Onset   Breast cancer Mother 70   Diabetes Father    Cancer Maternal Grandmother  Dementia Maternal Grandmother      HOME MEDICATIONS: Allergies as of 11/16/2023   No Known Allergies      Medication List        Accurate as of November 16, 2023  9:46 AM. If you have any questions, ask your nurse or doctor.          benzonatate 100 MG capsule Commonly known as: Tessalon Perles Take 1 capsule (100 mg total) by mouth 3 (three) times daily as needed for cough.   Dexcom G7 Receiver Devi Use to monitor blood sugars   Dexcom G7 Sensor Misc 1 Device by  Does not apply route as directed.   doxycycline 100 MG tablet Commonly known as: VIBRA-TABS Take 1 tablet (100 mg total) by mouth 2 (two) times daily.   fenofibrate 145 MG tablet Commonly known as: Tricor Take 1 tablet (145 mg total) by mouth daily.   Insulin Pen Needle 32G X 4 MM Misc 1 Device by Does not apply route in the morning, at noon, in the evening, and at bedtime.   Lantus SoloStar 100 UNIT/ML Solostar Pen Generic drug: insulin glargine Inject 6 Units into the skin daily.   levothyroxine 25 MCG tablet Commonly known as: SYNTHROID Take 1 tablet (25 mcg total) by mouth daily.   NovoLOG FlexPen 100 UNIT/ML FlexPen Generic drug: insulin aspart Max daily 45 units   promethazine-dextromethorphan 6.25-15 MG/5ML syrup Commonly known as: PROMETHAZINE-DM Take 5 mLs by mouth 4 (four) times daily as needed for cough.   rosuvastatin 20 MG tablet Commonly known as: Crestor Take 1 tablet (20 mg total) by mouth daily.         OBJECTIVE:   Vital Signs: BP 122/78 (BP Location: Left Arm, Patient Position: Sitting, Cuff Size: Normal)   Pulse 85   Ht 5\' 2"  (1.575 m)   Wt 164 lb (74.4 kg)   SpO2 99%   BMI 30.00 kg/m   Wt Readings from Last 3 Encounters:  11/16/23 164 lb (74.4 kg)  07/18/23 159 lb 9.6 oz (72.4 kg)  05/16/23 155 lb (70.3 kg)     Exam: General: Pt appears well and is in NAD  Lungs: Clear with good BS bilat   Heart: RRR   Extremities: No pretibial edema.   Neuro: MS is good with appropriate affect, pt is alert and Ox3      DM Foot Exam 11/16/2023  The skin of the feet is intact without sores or ulcerations. The pedal pulses are 1+ on right and 1+ on left. The sensation is intact to a screening 5.07, 10 gram monofilament bilaterally    DATA REVIEWED:  Lab Results  Component Value Date   HGBA1C 6.8 (A) 11/16/2023   HGBA1C 6.1 (A) 05/16/2023   HGBA1C 6.5 (A) 11/09/2022    Latest Reference Range & Units 05/16/23 09:49  Sodium 135 - 145 mEq/L  138  Potassium 3.5 - 5.1 mEq/L 4.3  Chloride 96 - 112 mEq/L 104  CO2 19 - 32 mEq/L 24  Glucose 70 - 99 mg/dL 478 (H)  BUN 6 - 23 mg/dL 31 (H)  Creatinine 2.95 - 1.20 mg/dL 6.21  Calcium 8.4 - 30.8 mg/dL 9.2  Alkaline Phosphatase 39 - 117 U/L 68  Albumin 3.5 - 5.2 g/dL 4.2  AST 0 - 37 U/L 14  ALT 0 - 35 U/L 12  Total Protein 6.0 - 8.3 g/dL 7.1  Total Bilirubin 0.2 - 1.2 mg/dL 0.4  GFR >65.78 mL/min 54.37 (L)    Latest Reference  Range & Units 05/16/23 09:49  Total CHOL/HDL Ratio  3  Cholesterol 0 - 200 mg/dL 098 (H)  HDL Cholesterol >39.00 mg/dL 11.91  LDL (calc) 0 - 99 mg/dL 478 (H)  MICROALB/CREAT RATIO 0.0 - 30.0 mg/g 1.4  NonHDL  209.67  Triglycerides 0.0 - 149.0 mg/dL 29.5  VLDL 0.0 - 62.1 mg/dL 30.8    Latest Reference Range & Units 05/16/23 09:49  TSH 0.35 - 5.50 uIU/mL 9.14 (H)     Latest Reference Range & Units 05/16/23 09:49  Creatinine,U mg/dL 657.8  Microalb, Ur 0.0 - 1.9 mg/dL 3.5 (H)  MICROALB/CREAT RATIO 0.0 - 30.0 mg/g 1.4     ASSESSMENT / PLAN / RECOMMENDATIONS:   1) Type 1 Diabetes Mellitus, Optimally controlled, With retinopathic complications - Most recent A1c of 6.8%. Goal A1c < 7.0 %.    -A1c remains at goal -Patient continues with fluctuating Bgs -We briefly discussed insulin pump technology, patient was advised to check the tandem pump as well as the OmniPod 5 -I will adjust insulin as below  MEDICATIONS:  Increase Lantus 8 units daily Change NovoLog 10 units TIDQAC Continue correction factor : NovoLog (BG -130/50) TIDQAC  EDUCATION / INSTRUCTIONS: BG monitoring instructions: Patient is instructed to check her blood sugars 3 times a day, before meals . Call [facility] if: BG persistently < 70  I reviewed the Rule of 15 for the treatment of hypoglycemia in detail with the patient. Literature supplied.    2) Diabetic complications:  Eye: Does have known diabetic retinopathy.  Neuro/ Feet: Does not have known diabetic  peripheral neuropathy .  Renal: Patient does not have known baseline CKD. She   is not on an ACEI/ARB at present.    3) Dyslipidemia:  -LDL levels above goal June September, 2023 labs, but I am concerned that her LDL increased from 112 Mg/DL to 469 Mg/DL, she did assure me compliance with rosuvastatin - We started  fenofibrate due to DR   Medication  Continue rosuvastatin 20 mg daily Continue fenofibrate 145 mg daily    4) Hypothyroidism:   - TSH was 9.14 uiU/mL 04/2023 - - Pt educated extensively on the correct way to take levothyroxine (first thing in the morning with water, 30 minutes before eating or taking other medications). - Pt encouraged to double dose the following day if she were to miss a dose given long half-life of levothyroxine.  Medication  levothyroxine 25 mcg daily    F/U in 6 months    Signed electronically by: Lyndle Herrlich, MD  [facility] Endocrinology  [facility] [address] [place] [place], [place] [address] Phone: [phone] FAX: [phone]   CC: Octavia Heir, NP [address] [place] [address] Phone: [phone]  Fax: [phone]  Return to Endocrinology clinic as below: Future Appointments  Date Time Provider Department Center  12/15/2023  1:05 PM Sherrie George, MD [facility] None

## 2023-11-17 ENCOUNTER — Encounter: Payer: Self-pay | Admitting: Internal Medicine

## 2023-11-17 MED ORDER — LEVOTHYROXINE SODIUM 50 MCG PO TABS: 50.0000 ug | ORAL_TABLET | Freq: Every day | ORAL | 3 refills | Status: DC

## 2023-11-21 ENCOUNTER — Ambulatory Visit: Payer: Self-pay

## 2023-11-21 ENCOUNTER — Encounter: Payer: Self-pay | Admitting: Adult Health

## 2023-11-21 ENCOUNTER — Ambulatory Visit: Payer: Self-pay | Admitting: Adult Health

## 2023-11-21 ENCOUNTER — Emergency Department (HOSPITAL_COMMUNITY)
Admission: EM | Admit: 2023-11-21 | Discharge: 2023-12-15 | Disposition: E | Attending: Emergency Medicine | Admitting: Emergency Medicine

## 2023-11-21 VITALS — Ht 62.0 in

## 2023-11-21 DIAGNOSIS — Z794 Long term (current) use of insulin: Secondary | ICD-10-CM | POA: Insufficient documentation

## 2023-11-21 DIAGNOSIS — I469 Cardiac arrest, cause unspecified: Secondary | ICD-10-CM | POA: Diagnosis present

## 2023-11-21 DIAGNOSIS — E119 Type 2 diabetes mellitus without complications: Secondary | ICD-10-CM | POA: Insufficient documentation

## 2023-11-21 DIAGNOSIS — R55 Syncope and collapse: Secondary | ICD-10-CM

## 2023-11-21 LAB — GLUCOSE, POCT (MANUAL RESULT ENTRY): POC Glucose: 421 mg/dL — AB (ref 70–99)

## 2023-11-21 LAB — I-STAT CHEM 8, ED
BUN: 40 mg/dL — ABNORMAL HIGH (ref 8–23)
Calcium, Ion: 1.18 mmol/L (ref 1.15–1.40)
Chloride: 103 mmol/L (ref 98–111)
Creatinine, Ser: 2.1 mg/dL — ABNORMAL HIGH (ref 0.44–1.00)
Glucose, Bld: 490 mg/dL — ABNORMAL HIGH (ref 70–99)
HCT: 29 % — ABNORMAL LOW (ref 36.0–46.0)
Hemoglobin: 9.9 g/dL — ABNORMAL LOW (ref 12.0–15.0)
Potassium: 3.3 mmol/L — ABNORMAL LOW (ref 3.5–5.1)
Sodium: 131 mmol/L — ABNORMAL LOW (ref 135–145)
TCO2: 15 mmol/L — ABNORMAL LOW (ref 22–32)

## 2023-11-21 LAB — CBG MONITORING, ED: Glucose-Capillary: 349 mg/dL — ABNORMAL HIGH (ref 70–99)

## 2023-11-21 MED ORDER — SODIUM BICARBONATE 8.4 % IV SOLN
INTRAVENOUS | Status: AC | PRN
Start: 2023-11-21 — End: 2023-11-21
  Administered 2023-11-21: 50 meq via INTRAVENOUS

## 2023-11-21 MED ORDER — EPINEPHRINE 1 MG/10ML IJ SOSY
PREFILLED_SYRINGE | INTRAMUSCULAR | Status: AC | PRN
  Administered 2023-11-21: 1 mg via INTRAVENOUS

## 2023-11-21 MED ORDER — EPINEPHRINE 1 MG/10ML IJ SOSY
PREFILLED_SYRINGE | INTRAMUSCULAR | Status: AC | PRN
  Administered 2023-11-21 (×3): 1 mg via INTRAVENOUS

## 2023-11-22 ENCOUNTER — Telehealth: Payer: Self-pay | Admitting: Orthopedic Surgery

## 2023-11-22 NOTE — Telephone Encounter (Signed)
 Please advise:  Copied from CRM [id number]. Topic: General - Deceased Patient >> 2023/12/11 10:44 AM Dennison Nancy wrote: Name of caller: Jesusita Oka hobbs medical examiner  The medical examiner is declining the case  Need Amy Coletta Memos to speak with Lynden Oxford at [facility] emergency , call cone  Date of death: 12-11-2023    Name of funeral home: N/A do not have that information yet   Phone number of funeral home: N/A   Provider that needs to sign form: Amy Fargo or  Timeline for signing: 3 days

## 2023-11-22 NOTE — Telephone Encounter (Signed)
 I have reviewed ED notes and discussed case with supervisor and NP who saw her yesterday. Death certificate has been signed.

## 2023-12-15 ENCOUNTER — Encounter (INDEPENDENT_AMBULATORY_CARE_PROVIDER_SITE_OTHER): Admitting: Ophthalmology

## 2023-12-15 NOTE — Code Documentation (Signed)
Patient time of death occurred at 10:05 

## 2023-12-15 NOTE — ED Triage Notes (Signed)
 Patient arrives as CPR in progress from PCP. Follow up for hyperglycemia and had witnessed arrest in the office. Immediate CPR by provider and had ROSC within 5 mins with agonal breathing, responsive to pain, and in a sinus arrhythmia. 3 epi given with EMS with persistent PEA/asystole.

## 2023-12-15 NOTE — Telephone Encounter (Signed)
 Patient scheduled an appointment for 4/7 with [facility].

## 2023-12-15 NOTE — Progress Notes (Signed)
 When CMA was about to take BP she got unconscious, no pulse, not breathing, pale. CPR was started and 911 was called. Patient regained consciousness then became unconscious again while paramedics were present. Patient was taken to ED by paramedics. Called daughter, Mayrani Khamis, and informed her of the event and that patient is being transferred to the hospital. - CBG was 421

## 2023-12-15 NOTE — Progress Notes (Signed)
   11/27/2023 1250  Spiritual Encounters  Type of Visit Initial  Care provided to: [facility] partners present during encounter Nurse  Reason for visit Patient death   Lunette Stands called to support family of Pt who came in receiving CPR and was not able to be resuscitated.  Daughter of Pt is only child and was adopted.  Experiencing grief and anxiety. Chaplain provided compassionate presence and comfort.  Chaplain offered to take Pt and support person back to see Pt but daughter decided she did not want to do that.  Chaplain obtained Pt belongings for daughter, as well as a Pt placement card and list of funeral homes in the area.  Daughter decided to leave.  No further spiritual care services needed.

## 2023-12-15 NOTE — ED Provider Notes (Signed)
 [place] EMERGENCY DEPARTMENT AT [facility] Provider Note   CSN: [id number] Arrival date & time: 11/16/2023  4098     History  No chief complaint on file.   Alicia Andrews is a 68 y.o. female.  The history is provided by the EMS personnel and medical records. The history is limited by the condition of the patient.  Cardiac Arrest Witnessed by:  Healthcare provider Incident location: PCP office. Time before ALS initiated:  Immediate Condition upon EMS arrival:  Agonal respirations and unresponsive Pulse:  Absent Initial cardiac rhythm per EMS:  PEA Rhythm on admission to ED:  Unchanged Risk factors: diabetes mellitus        Home Medications Prior to Admission medications   Medication Sig Start Date End Date Taking? Authorizing Provider  benzonatate (TESSALON PERLES) 100 MG capsule Take 1 capsule (100 mg total) by mouth 3 (three) times daily as needed for cough. Patient not taking: Reported on 11/17/2023 07/18/23   Medina-Vargas, Avanell Shackleton C, NP  Continuous Glucose Receiver (DEXCOM G7 RECEIVER) DEVI Use to monitor blood sugars 05/31/23   Shamleffer, Konrad Dolores, MD  Continuous Glucose Sensor (DEXCOM G7 SENSOR) MISC 1 Device by Does not apply route as directed. 05/16/23   Shamleffer, Konrad Dolores, MD  doxycycline (VIBRA-TABS) 100 MG tablet Take 1 tablet (100 mg total) by mouth 2 (two) times daily. Patient not taking: Reported on 11/16/2023 07/20/23   Waldon Merl, PA-C  fenofibrate (TRICOR) 145 MG tablet Take 1 tablet (145 mg total) by mouth daily. 05/17/23   Shamleffer, Konrad Dolores, MD  insulin aspart (NOVOLOG FLEXPEN) 100 UNIT/ML FlexPen Max daily 45 units 05/16/23   Shamleffer, Konrad Dolores, MD  insulin glargine (LANTUS SOLOSTAR) 100 UNIT/ML Solostar Pen Inject 8 Units into the skin daily. 11/16/23   Shamleffer, Konrad Dolores, MD  Insulin Pen Needle 32G X 4 MM MISC 1 Device by Does not apply route in the morning, at noon, in the evening, and at bedtime.  05/16/23   Shamleffer, Konrad Dolores, MD  levothyroxine (SYNTHROID) 50 MCG tablet Take 1 tablet (50 mcg total) by mouth daily. 11/17/23   Shamleffer, Konrad Dolores, MD  promethazine-dextromethorphan (PROMETHAZINE-DM) 6.25-15 MG/5ML syrup Take 5 mLs by mouth 4 (four) times daily as needed for cough. Patient not taking: Reported on 11/28/2023 07/20/23   Waldon Merl, PA-C  rosuvastatin (CRESTOR) 20 MG tablet Take 1 tablet (20 mg total) by mouth daily. 05/17/23   Shamleffer, Konrad Dolores, MD      Allergies    Patient has no known allergies.    Review of Systems   Review of Systems  Unable to perform ROS: Patient unresponsive    Physical Exam Updated Vital Signs Pulse (!) 0   Resp (!) 0  Physical Exam Vitals and nursing note reviewed.  Constitutional:      General: She is in acute distress.     Appearance: She is well-developed.  HENT:     Head: Atraumatic.     Nose: No congestion or rhinorrhea.     Mouth/Throat:     Pharynx: No oropharyngeal exudate or posterior oropharyngeal erythema.  Eyes:     Conjunctiva/sclera: Conjunctivae normal.  Cardiovascular:     Rate and Rhythm: Rhythm irregular.  Pulmonary:     Effort: Pulmonary effort is normal. No respiratory distress.     Breath sounds: Normal breath sounds. No wheezing or rales.  Chest:     Chest wall: No tenderness.  Abdominal:     Palpations: Abdomen is soft.  Tenderness: There is no abdominal tenderness. There is no guarding or rebound.  Musculoskeletal:        General: No swelling or tenderness.     Cervical back: No tenderness.  Skin:    General: Skin is warm and dry.     Capillary Refill: Capillary refill takes more than 3 seconds.     Findings: No erythema or rash.  Neurological:     Mental Status: She is unresponsive.     GCS: GCS eye subscore is 1. GCS verbal subscore is 1. GCS motor subscore is 1.     ED Results / Procedures / Treatments   Labs (all labs ordered are listed, but only abnormal  results are displayed) Labs Reviewed  I-STAT CHEM 8, ED - Abnormal; Notable for the following components:      Result Value   Sodium 131 (*)    Potassium 3.3 (*)    BUN 40 (*)    Creatinine, Ser 2.10 (*)    Glucose, Bld 490 (*)    TCO2 15 (*)    Hemoglobin 9.9 (*)    HCT 29.0 (*)    All other components within normal limits  CBG MONITORING, ED - Abnormal; Notable for the following components:   Glucose-Capillary 349 (*)    All other components within normal limits    EKG None  Radiology No results found.  Procedures Procedure Name: Intubation Date/Time: 12/07/2023 10:43 AM  Performed by: Heide Scales, MDPre-anesthesia Checklist: Patient identified, Emergency Drugs available, Suction available, Patient being monitored and Timeout performed Oxygen Delivery Method: Ambu bag Laryngoscope Size: Glidescope Grade View: Grade I Tube size: 7.5 mm Number of attempts: 1 Placement Confirmation: ETT inserted through vocal cords under direct vision, CO2 detector and Breath sounds checked- equal and bilateral Secured at: 22 cm Tube secured with: ETT holder Dental Injury: Teeth and Oropharynx as per pre-operative assessment       CRITICAL CARE Performed by: Canary Brim Tranise Forrest Total critical care time: 30 minutes Critical care time was exclusive of separately billable procedures and treating other patients. Critical care was necessary to treat or prevent imminent or life-threatening deterioration. Critical care was time spent personally by me on the following activities: development of treatment plan with patient and/or surrogate as well as nursing, discussions with consultants, evaluation of patient's response to treatment, examination of patient, obtaining history from patient or surrogate, ordering and performing treatments and interventions, ordering and review of laboratory studies, ordering and review of radiographic studies, pulse oximetry and re-evaluation of  patient's condition.     Medications Ordered in ED Medications  EPINEPHrine (ADRENALIN) 1 MG/10ML injection (1 mg Intravenous Given 12/12/2023 1002)  EPINEPHrine (ADRENALIN) 1 MG/10ML injection (1 mg Intravenous Given 12/02/2023 0958)  sodium bicarbonate injection (50 mEq Intravenous Given 12/08/2023 1003)    ED Course/ Medical Decision Making/ A&P                                 Medical Decision Making Risk Prescription drug management.   Alicia Andrews is a 68 y.o. female with a past medical history significant for diabetes and hyperlipidemia who presents in cardiac arrest.  According to EMS and documentation PCP visit, patient was presenting for evaluation of hyperglycemia and then during intake and vital signs went into cardiac arrest.  She briefly had ROSC for about a minute before going back into PEA/asystole.  Glucoses were reportedly within the 400s and  300s and patient was completely unresponsive.  EMS gave good CPR and 3 doses of epinephrine without any response.  They were unable to intubate in the field and did nasal trumpets and oropharyngeal airway.  On arrival, patient arrives in PEA arrest.  She is getting chest compressions from the [place].  Initial pulse check was PEA.  CPR was continued.  We gave several doses of epinephrine and bicarb without any change.  We continued CPR for several more rounds without any response to pain.  Patient was not breathing spontaneously.  She had PEA that progressed towards asystole.  She never had a pulse here in the emergency department.  After securing the airway and several more rounds of CPR without any pulses decision was made to terminate resuscitative efforts.  Time of death was called at 10:05 AM.  I called and spoke to the medical examiner, Melynda Ripple, who will determine if the patient is a medical examiner case or not.  I also spoke to the nurse practitioner at the PCP office who said that if she is not any case, they would be able to do this  significant.  They reported the PCP was Hazle Nordmann, NP.  The daughter arrived and was informed of the patient's death.  Chaplain/spiritual care was consulted.    Will await recommendations from medical examiner as to if she is an ME case or not given the unexpected and sudden nature of passing with only primary history being diabetes and dyslipidemia.  Medical examiner says this is not an ME case.  Death certificate will go to PCP        Final Clinical Impression(s) / ED Diagnoses Final diagnoses:  Cardiac arrest [facility])     Clinical Impression: 1. Cardiac arrest (HCC)     Disposition: Expired.  Patient's daughter informed of the death in person and spoke to both medical examiner who will determine if she has an ME case or not.  Also spoke to nurse practitioner team who said that they would fill out the death certificate if not an ME case to Hazle Nordmann, NP.  This note was prepared with assistance of Conservation officer, historic buildings. Occasional wrong-word or sound-a-like substitutions may have occurred due to the inherent limitations of voice recognition software.      Corlette Ciano, Canary Brim, MD 11/24/2023 [phone]

## 2023-12-15 NOTE — Telephone Encounter (Signed)
 Chief Complaint: nausea Symptoms: nausea, indigestion, chest pain, elevated glucose Frequency: friday Pertinent Negatives: Patient denies current chest pain Disposition: [] ED /[] Urgent Care (no appt availability in office) / [x] Appointment(In office/virtual)/ []  [place] [facility]/ [] Home Care/ [] Refused Recommended Disposition /[] [place] [facility]/ []  Follow-up with PCP Additional Notes: Pt states that on Friday she started having nausea and indigestion which cause her to have pain in the center of her breast bone. Denies current chest pain or sob. States she also had a issue keeping her blood glucose in range. States normally between 110-130 but this weekend has been ranging in high 200-300.   Copied from CRM [id number]. Topic: Clinical - Red Word Triage >> Nov 21, 2023  7:59 AM Hamdi H wrote: Red Word that prompted transfer to Nurse Triage: Severe nausea and indigestion, pain in chest started Friday night. Reason for Disposition  Taking prescription medication that could cause nausea (e.g., narcotics/opiates, antibiotics, OCPs, many others)  Answer Assessment - Initial Assessment Questions 1. NAUSEA SEVERITY: "How bad is the nausea?" (e.g., mild, moderate, severe; dehydration, weight loss)   - MILD: loss of appetite without change in eating habits   - MODERATE: decreased oral intake without significant weight loss, dehydration, or malnutrition   - SEVERE: inadequate caloric or fluid intake, significant weight loss, symptoms of dehydration     mild 2. ONSET: "When did the nausea begin?"     friday 3. VOMITING: "Any vomiting?" If Yes, ask: "How many times today?"     no 4. RECURRENT SYMPTOM: "Have you had nausea before?" If Yes, ask: "When was the last time?" "What happened that time?"     no 5. CAUSE: "What do you think is causing the nausea?"     unknown  Protocols used: Nausea-A-AH

## 2023-12-15 DEATH — deceased

## 2024-05-17 ENCOUNTER — Ambulatory Visit: Admitting: Internal Medicine
